# Patient Record
Sex: Female | Born: 1990
Health system: Southern US, Community
[De-identification: ages and names within clinical notes are randomized; demographics above are authoritative.]

## PROBLEM LIST (undated history)

## (undated) DIAGNOSIS — K219 Gastro-esophageal reflux disease without esophagitis: Secondary | ICD-10-CM

---
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
## 2011-06-30 HISTORY — PX: MOUTH SURGERY: SHX715

## 2015-10-05 ENCOUNTER — Emergency Department (HOSPITAL_COMMUNITY)
Admission: EM | Admit: 2015-10-05 | Discharge: 2015-10-05 | Disposition: A | Discharge status: Home / Self Care | Payer: No Typology Code available for payment source | Attending: Emergency Medicine | Admitting: Emergency Medicine

## 2015-10-05 ENCOUNTER — Encounter (HOSPITAL_COMMUNITY): Payer: Self-pay | Admitting: *Deleted

## 2015-10-05 ENCOUNTER — Emergency Department (HOSPITAL_COMMUNITY): Payer: No Typology Code available for payment source

## 2015-10-05 DIAGNOSIS — Z3202 Encounter for pregnancy test, result negative: Secondary | ICD-10-CM | POA: Diagnosis not present

## 2015-10-05 DIAGNOSIS — Y998 Other external cause status: Secondary | ICD-10-CM | POA: Diagnosis not present

## 2015-10-05 DIAGNOSIS — M62830 Muscle spasm of back: Secondary | ICD-10-CM | POA: Insufficient documentation

## 2015-10-05 DIAGNOSIS — S199XXA Unspecified injury of neck, initial encounter: Secondary | ICD-10-CM | POA: Diagnosis present

## 2015-10-05 DIAGNOSIS — Y92410 Unspecified street and highway as the place of occurrence of the external cause: Secondary | ICD-10-CM | POA: Insufficient documentation

## 2015-10-05 DIAGNOSIS — S161XXA Strain of muscle, fascia and tendon at neck level, initial encounter: Secondary | ICD-10-CM | POA: Diagnosis not present

## 2015-10-05 DIAGNOSIS — Y9389 Activity, other specified: Secondary | ICD-10-CM | POA: Insufficient documentation

## 2015-10-05 DIAGNOSIS — S3992XA Unspecified injury of lower back, initial encounter: Secondary | ICD-10-CM | POA: Diagnosis not present

## 2015-10-05 DIAGNOSIS — F17200 Nicotine dependence, unspecified, uncomplicated: Secondary | ICD-10-CM | POA: Diagnosis not present

## 2015-10-05 MED ORDER — NAPROXEN 500 MG PO TABS: 500.0000 mg | ORAL_TABLET | Freq: Two times a day (BID) | ORAL | Status: DC

## 2015-10-05 MED ORDER — CYCLOBENZAPRINE HCL 5 MG PO TABS: 5.0000 mg | ORAL_TABLET | Freq: Three times a day (TID) | ORAL | Status: DC | PRN
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
## 2015-10-05 NOTE — Discharge Instructions (Signed)
Do not take the muscle relaxant if driving as it will make you sleepy.  °

## 2015-10-07 LAB — POC URINE PREG, ED: Preg Test, Ur: NEGATIVE

## 2017-05-09 ENCOUNTER — Emergency Department (HOSPITAL_COMMUNITY): Payer: Managed Care, Other (non HMO)

## 2017-05-09 ENCOUNTER — Encounter (HOSPITAL_COMMUNITY): Payer: Self-pay

## 2017-05-09 ENCOUNTER — Emergency Department (HOSPITAL_COMMUNITY)
Admission: EM | Admit: 2017-05-09 | Discharge: 2017-05-10 | Disposition: A | Discharge status: Home / Self Care | Payer: Managed Care, Other (non HMO) | Attending: Emergency Medicine | Admitting: Emergency Medicine

## 2017-05-09 ENCOUNTER — Other Ambulatory Visit: Payer: Self-pay

## 2017-05-09 DIAGNOSIS — X509XXA Other and unspecified overexertion or strenuous movements or postures, initial encounter: Secondary | ICD-10-CM | POA: Diagnosis not present

## 2017-05-09 DIAGNOSIS — Y999 Unspecified external cause status: Secondary | ICD-10-CM | POA: Insufficient documentation

## 2017-05-09 DIAGNOSIS — S93402A Sprain of unspecified ligament of left ankle, initial encounter: Secondary | ICD-10-CM | POA: Insufficient documentation

## 2017-05-09 DIAGNOSIS — Y92009 Unspecified place in unspecified non-institutional (private) residence as the place of occurrence of the external cause: Secondary | ICD-10-CM | POA: Insufficient documentation

## 2017-05-09 DIAGNOSIS — M79672 Pain in left foot: Secondary | ICD-10-CM

## 2017-05-09 DIAGNOSIS — M25562 Pain in left knee: Secondary | ICD-10-CM

## 2017-05-09 DIAGNOSIS — S99922A Unspecified injury of left foot, initial encounter: Secondary | ICD-10-CM | POA: Diagnosis present

## 2017-05-09 DIAGNOSIS — F17200 Nicotine dependence, unspecified, uncomplicated: Secondary | ICD-10-CM | POA: Diagnosis not present

## 2017-05-09 DIAGNOSIS — Y9383 Activity, rough housing and horseplay: Secondary | ICD-10-CM | POA: Diagnosis not present

## 2017-05-09 HISTORY — DX: Gastro-esophageal reflux disease without esophagitis: K21.9

## 2017-05-09 MED ORDER — IBUPROFEN 800 MG PO TABS: 800.0000 mg | ORAL_TABLET | Freq: Three times a day (TID) | ORAL | 0 refills | Status: DC | PRN

## 2017-05-09 NOTE — ED Provider Notes (Signed)
  Physical Exam  BP 135/84   Pulse 86   Temp 99 F (37.2 C) (Oral)   Resp 17   LMP 04/15/2017   SpO2 100%   Physical Exam  Constitutional: She appears well-developed and well-nourished. No distress.  Sitting comfortably in bed.  HENT:  Head: Normocephalic and atraumatic.  Eyes: Conjunctivae are normal. Right eye exhibits no discharge. Left eye exhibits no discharge.  EOMs normal to gross examination.  Neck: Normal range of motion.  Cardiovascular: Normal rate and regular rhythm.  Intact, 2+ DP pulse of the left lower extremity.  Pulmonary/Chest:  Normal respiratory effort. Patient converses comfortably. No audible wheeze or stridor.  Abdominal: She exhibits no distension.  Musculoskeletal: Normal range of motion.  Compartments soft.  Neurological: She is alert.  Cranial nerves intact to gross observation. Patient moves extremities without difficulty.  Skin: Skin is warm and dry. She is not diaphoretic.  Psychiatric: She has a normal mood and affect. Her behavior is normal. Judgment and thought content normal.  Nursing note and vitals reviewed.   ED Course  Procedures  Assumed care from Dr. Jacqulyn BathLong at 2130. Briefly, the patient is a 26 y.o. female with PMHx of  has a past medical history of GERD (gastroesophageal reflux disease). here with ankle pain secondary to a hyperextension injury that occurred while playing with her children.   Labs Reviewed - No data to display  Course of Care:  On handoff, plan is a patient does not have a proximal fibula fracture, she can be safely discharged with Aircast, crutches, and weightbearing as tolerated.  MDM On my final evaluation, patient was well-appearing and in no acute distress.  Patient informed of results that proximal fibula is not fractured.  Patient given follow-up with Dr. Pearletha ForgeHudnall and sports medicine.  Return precautions given for any signs of compartment syndrome such as increasing swelling, pallor, increasing pain, or a cold  extremity.  Counseled patient on RICE therapy.  Patient is in understanding and agrees with the plan of care.      Elisha PonderMurray, Marquay Kruse B, PA-C 05/09/17 2338    Maia PlanLong, Joshua G, MD 05/10/17 [phone]

## 2017-05-09 NOTE — ED Notes (Signed)
Patient transported to X-ray 

## 2017-05-09 NOTE — ED Provider Notes (Signed)
Emergency Department Provider Note   I have reviewed the triage vital signs and the nursing notes.   HISTORY  Chief Complaint  Ankle pain  HPI Catherine SprungLaura Figueroa is a 26 y.o. female with PMH of GERD presents to the emergency department for evaluation of left ankle pain and swelling after injury earlier today.  The patient was playing with her children at approximately 10 AM when she rolled the ankle.  She has been walking on the foot throughout the day but has had progressively worsening ankle and foot pain with swelling.  She is tried over-the-counter pain medication with mild relief.  When pain continued throughout the evening she presented to the emergency department.  She denies any falls with head trauma.  No hip discomfort.  Denies knee pain.  No numbness or tingling in the foot.   Past Medical History:  Diagnosis Date  . GERD (gastroesophageal reflux disease)     There are no active problems to display for this patient.   History reviewed. No pertinent surgical history.  Current Outpatient Rx  . Order #: [id number] Class: Print  . Order #: [id number] Class: Print  . Order #: [id number] Class: Print    Allergies Patient has no known allergies.  No family history on file.  Social History Social History   Tobacco Use  . Smoking status: Current Every Day Smoker  . Smokeless tobacco: Never Used  Substance Use Topics  . Alcohol use: No  . Drug use: Not on file    Review of Systems  Constitutional: No fever/chills Eyes: No visual changes. ENT: No sore throat. Cardiovascular: Denies chest pain. Respiratory: Denies shortness of breath. Gastrointestinal: No abdominal pain.  No nausea, no vomiting.  No diarrhea.  No constipation. Genitourinary: Negative for dysuria. Musculoskeletal: Negative for back pain. Positive left ankle pain.  Skin: Negative for rash. Neurological: Negative for headaches, focal weakness or numbness.  10-point ROS otherwise  negative.  ____________________________________________   PHYSICAL EXAM:  VITAL SIGNS: ED Triage Vitals  Enc Vitals Group     BP 05/09/17 1828 (!) 143/81     Pulse Rate 05/09/17 1828 100     Resp 05/09/17 1828 16     Temp 05/09/17 1828 99 F (37.2 C)     Temp Source 05/09/17 1828 Oral     SpO2 05/09/17 1828 100 %     Pain Score 05/09/17 1827 10    Constitutional: Alert and oriented. Well appearing and in no acute distress. Eyes: Conjunctivae are normal.  Head: Atraumatic. Nose: No congestion/rhinnorhea. Mouth/Throat: Mucous membranes are moist.  Neck: No stridor.   Cardiovascular: Good peripheral circulation.  Respiratory: Normal respiratory effort.   Gastrointestinal: No distention.  Musculoskeletal: Left ankle tenderness laterally. No midfoot tenderness to palpation. Patient does have some left proximal fibular tenderness to palpation on the left.  Neurologic:  Normal speech and language. No gross focal neurologic deficits are appreciated.  Skin:  Skin is warm, dry and intact. No rash noted.  ____________________________________________  RADIOLOGY  Dg Tibia/fibula Left  Result Date: 05/09/2017 CLINICAL DATA:  Left lower leg tenderness beginning today. EXAM: LEFT TIBIA AND FIBULA - 2 VIEW COMPARISON:  None. FINDINGS: There is no evidence of fracture or other focal bone lesions. Soft tissues are unremarkable. IMPRESSION: Negative. Electronically Signed   By: Elberta Fortisaniel  Boyle M.D.   On: 05/09/2017 21:58   Dg Ankle Complete Left  Result Date: 05/09/2017 CLINICAL DATA:  Rolled left ankle and hyperextended left foot while playing with children today. EXAM: LEFT ANKLE  COMPLETE - 3+ VIEW COMPARISON:  None. FINDINGS: There is no evidence of fracture, dislocation, or joint effusion. There is no evidence of arthropathy or other focal bone abnormality. Soft tissues are unremarkable. IMPRESSION: Negative. Electronically Signed   By: Elberta Fortisaniel  Boyle M.D.   On: 05/09/2017 19:54   Dg Foot  Complete Left  Result Date: 05/09/2017 CLINICAL DATA:  Rolled left ankle with hyperextension of left foot this morning while applying. EXAM: LEFT FOOT - COMPLETE 3+ VIEW COMPARISON:  None. FINDINGS: There is no evidence of fracture or dislocation. There is no evidence of arthropathy or other focal bone abnormality. Soft tissues are unremarkable. IMPRESSION: Negative. Electronically Signed   By: Elberta Fortisaniel  Boyle M.D.   On: 05/09/2017 19:54    ____________________________________________   PROCEDURES  Procedure(s) performed:   Procedures  None ____________________________________________   INITIAL IMPRESSION / ASSESSMENT AND PLAN / ED COURSE  Pertinent labs & imaging results that were available during my care of the patient were reviewed by me and considered in my medical decision making (see chart for details).  Patient presents emergency department for evaluation of left ankle and foot pain after injury this morning.  Plain films from triage showed no fracture.  Suspect ankle sprain.  Plan for crutches and Aircast.  On my evaluation the patient also has some proximal fibular tenderness.  Compartments are soft.  Lower extremity is neurovascularly intact.  Plan to send patient back to x-ray for knee films to rule out fibular fracture.   Plain films negative. Air cast and crutches provided for comfort. Plan for RICE at home.   At this time, I do not feel there is any life-threatening condition present. I have reviewed and discussed all results (EKG, imaging, lab, urine as appropriate), exam findings with patient. I have reviewed nursing notes and appropriate previous records.  I feel the patient is safe to be discharged home without further emergent workup. Discussed usual and customary return precautions. Patient and family (if present) verbalize understanding and are comfortable with this plan.  Patient will follow-up with their primary care provider. If they do not have a primary care  provider, information for follow-up has been provided to them. All questions have been answered.  ____________________________________________  FINAL CLINICAL IMPRESSION(S) / ED DIAGNOSES  Final diagnoses:  Sprain of left ankle, unspecified ligament, initial encounter  Left foot pain  Acute pain of left knee     MEDICATIONS GIVEN DURING THIS VISIT:  None  NEW OUTPATIENT MEDICATIONS STARTED DURING THIS VISIT:  Motrin 800 mg   Note:  This document was prepared using Dragon voice recognition software and may include unintentional dictation errors.  Alona BeneJoshua Montavious Wierzba, MD Emergency Medicine    Jamiya Nims, Arlyss RepressJoshua G, MD 05/10/17 [phone]

## 2017-05-09 NOTE — ED Triage Notes (Signed)
Patient rolled ankle this am while playing with kids this am, pain with any ROM to ankle and foot, no obvious deformity

## 2017-05-09 NOTE — ED Notes (Signed)
Pt verbalized understanding discharge instructions and denies any further needs or questions at this time. VS stable, ambulatory and steady gait.   

## 2017-05-09 NOTE — Progress Notes (Signed)
Orthopedic Tech Progress Note Patient Details:  Catherine Figueroa [birth year] [id number]  Ortho Devices Type of Ortho Device: Ankle Air splint, Crutches Ortho Device/Splint Location: LLE Ortho Device/Splint Interventions: Ordered, Application, Adjustment   Jennye MoccasinHughes, Adalid Beckmann Craig 05/09/2017, 9:45 PM

## 2017-05-09 NOTE — Discharge Instructions (Signed)
As we discussed, you do not have any broken or dislocated bones in your foot or ankle, but you do have an ankle sprain. There is always a chance that a small fracture did not appear on today's x-ray. Please read through the included information about routine injury care (RICE = rest, ice, compression, elevation), and take over-the-counter pain medicine according to label instructions.  If you do not have any reason to avoid ibuprofen, you can also consider taking ibuprofen 600 mg 3 times a day with meals, but do this for no more than 5 days as it may cause to some stomach discomfort over time.  Use crutches if provided and you may bear weight as tolerated.  Follow-up is recommended with the orthopedic surgeon or with your regular doctor. ° ° °Ankle Sprain °An ankle sprain is an injury to the strong, fibrous tissues (ligaments) that hold the bones of your ankle joint together.  °CAUSES °An ankle sprain is usually caused by a fall or by twisting your ankle. Ankle sprains most commonly occur when you step on the outer edge of your foot, and your ankle turns inward. People who participate in sports are more prone to these types of injuries.  °SYMPTOMS  °Pain in your ankle. The pain may be present at rest or only when you are trying to stand or walk. °Swelling. °Bruising. Bruising may develop immediately or within 1 to 2 days after your injury. °Difficulty standing or walking, particularly when turning corners or changing directions. °DIAGNOSIS  °Your caregiver will ask you details about your injury and perform a physical exam of your ankle to determine if you have an ankle sprain. During the physical exam, your caregiver will press on and apply pressure to specific areas of your foot and ankle. Your caregiver will try to move your ankle in certain ways. An X-ray exam may be done to be sure a bone was not broken or a ligament did not separate from one of the bones in your ankle (avulsion fracture).  °TREATMENT  °Certain  types of braces can help stabilize your ankle. Your caregiver can make a recommendation for this. Your caregiver may recommend the use of medicine for pain. If your sprain is severe, your caregiver may refer you to a surgeon who helps to restore function to parts of your skeletal system (orthopedist) or a physical therapist. °HOME CARE INSTRUCTIONS  °Apply ice to your injury for 1-2 days or as directed by your caregiver. Applying ice helps to reduce inflammation and pain. °Put ice in a plastic bag. °Place a towel between your skin and the bag. °Leave the ice on for 15-20 minutes at a time, every 2 hours while you are awake. °Only take over-the-counter or prescription medicines for pain, discomfort, or fever as directed by your caregiver. °Elevate your injured ankle above the level of your heart as much as possible for 2-3 days. °If your caregiver recommends crutches, use them as instructed. Gradually put weight on the affected ankle. Continue to use crutches or a cane until you can walk without feeling pain in your ankle. °If you have a plaster splint, wear the splint as directed by your caregiver. Do not rest it on anything harder than a pillow for the first 24 hours. Do not put weight on it. Do not get it wet. You may take it off to take a shower or bath. °You may have been given an elastic bandage to wear around your ankle to provide support. If the elastic bandage   is too tight (you have numbness or tingling in your foot or your foot becomes cold and blue), adjust the bandage to make it comfortable. °If you have an air splint, you may blow more air into it or let air out to make it more comfortable. You may take your splint off at night and before taking a shower or bath. Wiggle your toes in the splint several times per day to decrease swelling. °SEEK MEDICAL CARE IF:  °You have rapidly increasing bruising or swelling. °Your toes feel extremely cold or you lose feeling in your foot. °Your pain is not relieved  with medicine. °SEEK IMMEDIATE MEDICAL CARE IF: °Your toes are numb or blue. °You have severe pain that is increasing. °MAKE SURE YOU:  °Understand these instructions. °Will watch your condition. °Will get help right away if you are not doing well or get worse. °  °This information is not intended to replace advice given to you by your health care provider. Make sure you discuss any questions you have with your health care provider. °  °Document Released: 06/15/2005 Document Revised: 07/06/2014 Document Reviewed: 06/27/2011 °Elsevier Interactive Patient Education ©2016 [employer] ° °Elastic Bandage and RICE °WHAT DOES AN ELASTIC BANDAGE DO? °Elastic bandages come in different shapes and sizes. They generally provide support to your injury and reduce swelling while you are healing, but they can perform different functions. Your health care provider will help you to decide what is best for your protection, recovery, or rehabilitation following an injury. °WHAT ARE SOME GENERAL TIPS FOR USING AN ELASTIC BANDAGE? °Use the bandage as directed by the maker of the bandage that you are using. °Do not wrap the bandage too tightly. This may cut off the circulation in the arm or leg in the area below the bandage. °If part of your body beyond the bandage becomes blue, numb, cold, swollen, or is more painful, your bandage is most likely too tight. If this occurs, remove your bandage and reapply it more loosely. °See your health care provider if the bandage seems to be making your problems worse rather than better. °An elastic bandage should be removed and reapplied every 3-4 hours or as directed by your health care provider. °WHAT IS RICE? °The routine care of many injuries includes rest, ice, compression, and elevation (RICE therapy).  °Rest °Rest is required to allow your body to heal. Generally, you can resume your routine activities when you are comfortable and have been given permission by your health care  provider. °Ice °Icing your injury helps to keep the swelling down and it reduces pain. Do not apply ice directly to your skin. °Put ice in a plastic bag. °Place a towel between your skin and the bag. °Leave the ice on for 20 minutes, 2-3 times per day. °Do this for as Aram Domzalski as you are directed by your health care provider. °Compression °Compression helps to keep swelling down, gives support, and helps with discomfort. Compression may be done with an elastic bandage. °Elevation °Elevation helps to reduce swelling and it decreases pain. If possible, your injured area should be placed at or above the level of your heart or the center of your chest. °WHEN SHOULD I SEEK MEDICAL CARE? °You should seek medical care if: °You have persistent pain and swelling. °Your symptoms are getting worse rather than improving. °These symptoms may indicate that further evaluation or further X-rays are needed. Sometimes, X-rays may not show a small broken bone (fracture) until a number of days later. Make   a follow-up appointment with your health care provider. Ask when your X-ray results will be ready. Make sure that you get your X-ray results. °WHEN SHOULD I SEEK IMMEDIATE MEDICAL CARE? °You should seek immediate medical care if: °You have a sudden onset of severe pain at or below the area of your injury. °You develop redness or increased swelling around your injury. °You have tingling or numbness at or below the area of your injury that does not improve after you remove the elastic bandage. °  °This information is not intended to replace advice given to you by your health care provider. Make sure you discuss any questions you have with your health care provider. °  °Document Released: 12/05/2001 Document Revised: 03/06/2015 Document Reviewed: 01/29/2014 °Elsevier Interactive Patient Education ©2016 [employer] Inc[employer] ° °  °

## 2017-05-11 ENCOUNTER — Ambulatory Visit (INDEPENDENT_AMBULATORY_CARE_PROVIDER_SITE_OTHER): Payer: Managed Care, Other (non HMO) | Admitting: Family Medicine

## 2017-05-11 ENCOUNTER — Encounter: Payer: Self-pay | Admitting: Family Medicine

## 2017-05-11 DIAGNOSIS — S99912A Unspecified injury of left ankle, initial encounter: Secondary | ICD-10-CM | POA: Diagnosis not present

## 2017-05-11 MED ORDER — MELOXICAM 7.5 MG PO TABS: 7.5000 mg | ORAL_TABLET | Freq: Every day | ORAL | 1 refills | Status: DC

## 2017-05-11 MED ORDER — HYDROCODONE-ACETAMINOPHEN 5-325 MG PO TABS: 1.0000 | ORAL_TABLET | ORAL | 0 refills | Status: DC | PRN

## 2017-05-11 NOTE — Patient Instructions (Addendum)
You have a severe ankle sprain. Ice the area for 15 minutes at a time, 3-4 times a day Meloxicam 7.5mg  daily with food for pain and inflammation. Norco as needed for severe pain. Elevate above the level of your heart when possible Crutches if needed to help with walking Bear weight when tolerated Use boot when up and walking around to help with stability while you recover from this injury. Come out of the boot twice a day to do Up/down and alphabet exercises 2-3 sets of each. Consider physical therapy for strengthening and balance exercises in the future. If not improving as expected, we may repeat x-rays or consider further testing like an MRI. Follow up in 2 weeks (1 week at the earliest).

## 2017-05-12 ENCOUNTER — Encounter: Payer: Self-pay | Admitting: Family Medicine

## 2017-05-12 DIAGNOSIS — S99912D Unspecified injury of left ankle, subsequent encounter: Secondary | ICD-10-CM | POA: Insufficient documentation

## 2017-05-12 NOTE — Progress Notes (Signed)
PCP: Patient, No Pcp Per  Subjective:   HPI: Patient is a 26 y.o. female here for left ankle injury.  Patient reports on 11/11 she was playing with her kids when she suffered hyperplantarflexion injury to her left ankle. Unable to bear weight since this. Pain is 7/10 level but up to 10/10 and sharp, anterior ankle. Has been taking tylenol, icing. Using crutches and not putting weight on this foot. Wearing short aircast brace but feels uncomfortable. + swelling but no skin changes. History of ankle sprains in the past.  Past Medical History:  Diagnosis Date  . GERD (gastroesophageal reflux disease)     Current Outpatient Medications on File Prior to Visit  Medication Sig Dispense Refill  . cyclobenzaprine (FLEXERIL) 5 MG tablet Take 1 tablet (5 mg total) by mouth 3 (three) times daily as needed for muscle spasms. 30 tablet 0   No current facility-administered medications on file prior to visit.     History reviewed. No pertinent surgical history.  No Known Allergies  Social History   Socioeconomic History  . Marital status: Married    Spouse name: Not on file  . Number of children: Not on file  . Years of education: Not on file  . Highest education level: Not on file  Social Needs  . Financial resource strain: Not on file  . Food insecurity - worry: Not on file  . Food insecurity - inability: Not on file  . Transportation needs - medical: Not on file  . Transportation needs - non-medical: Not on file  Occupational History  . Not on file  Tobacco Use  . Smoking status: Current Every Day Smoker  . Smokeless tobacco: Never Used  Substance and Sexual Activity  . Alcohol use: No  . Drug use: Not on file  . Sexual activity: Not on file  Other Topics Concern  . Not on file  Social History Narrative  . Not on file    History reviewed. No pertinent family history.  BP 110/76   Pulse 86   Ht 5\' 5"  (1.651 m)   Wt 145 lb (65.8 kg)   LMP 04/15/2017   BMI 24.13  kg/m   Review of Systems: See HPI above.     Objective:  Physical Exam:  Gen: NAD, guarding during exam  Left ankle/foot: Mild anterior ankle swelling.  No bruising, other deformity. Very limited motion all directions but able to do so with some strength. TTP anterior ankle joint, proximal dorsal foot Pain with ant drawer and talar tilt, guarding. Negative syndesmotic compression. Thompsons test negative. NV intact distally.  Right ankle/foot: No gross deformity, swelling, ecchymoses FROM with full strength. No TTP Negative ant drawer and talar tilt.   NV intact distally.   Assessment & Plan:  1. Left ankle injury - independently reviewed radiographs and no evidence fracture.  Switch to cam walker with crutches as needed.  Meloxicam with norco as needed for severe pain.  Icing, elevation.  Shown motion exercises to do daily.  F/u in 2 weeks.

## 2017-05-12 NOTE — Assessment & Plan Note (Signed)
independently reviewed radiographs and no evidence fracture.  Switch to cam walker with crutches as needed.  Meloxicam with norco as needed for severe pain.  Icing, elevation.  Shown motion exercises to do daily.  F/u in 2 weeks.

## 2017-05-25 ENCOUNTER — Ambulatory Visit (HOSPITAL_BASED_OUTPATIENT_CLINIC_OR_DEPARTMENT_OTHER)
Admission: RE | Admit: 2017-05-25 | Discharge: 2017-05-25 | Disposition: A | Discharge status: Home / Self Care | Payer: Managed Care, Other (non HMO) | Source: Ambulatory Visit | Attending: Family Medicine | Admitting: Family Medicine

## 2017-05-25 ENCOUNTER — Ambulatory Visit (INDEPENDENT_AMBULATORY_CARE_PROVIDER_SITE_OTHER): Payer: Managed Care, Other (non HMO) | Admitting: Family Medicine

## 2017-05-25 ENCOUNTER — Encounter: Payer: Self-pay | Admitting: Family Medicine

## 2017-05-25 VITALS — BP 123/85 | HR 88 | Ht 65.0 in | Wt 140.0 lb

## 2017-05-25 DIAGNOSIS — S99922D Unspecified injury of left foot, subsequent encounter: Secondary | ICD-10-CM

## 2017-05-25 DIAGNOSIS — S99912D Unspecified injury of left ankle, subsequent encounter: Secondary | ICD-10-CM

## 2017-05-25 DIAGNOSIS — X58XXXD Exposure to other specified factors, subsequent encounter: Secondary | ICD-10-CM | POA: Diagnosis not present

## 2017-05-25 NOTE — Patient Instructions (Signed)
You have a severe ankle sprain. Ice the area for 15 minutes at a time, 3-4 times a day Meloxicam 7.5mg  daily with food for pain and inflammation. Elevate above the level of your heart when possible Use boot when up and walking around to help with stability while you recover from this injury. Come out of the boot twice a day to do Up/down and alphabet exercises 2-3 sets of each. When tolerated start doing the theraband strengthening exercises 3 sets of 10 once a day with yellow theraband. Let me know if you want to go ahead with physical therapy or MRI. Follow up with me right before or after christmas for reevaluation otherwise.

## 2017-05-26 ENCOUNTER — Encounter: Payer: Self-pay | Admitting: Family Medicine

## 2017-05-26 NOTE — Assessment & Plan Note (Signed)
still guarding on exam and tender diffusely - no evidence CRPS.  Radiographs were reassuring.  Brief MSK u/s without evidence occult fracture malleoli, talus, and without evidence tendon rupture.  Continue cam walker.  Meloxicam, icing, elevation.  She declined physical therapy, MRI for now - call us if she would like to proceed with therapy.  F/u in 4-6 weeks.

## 2017-05-26 NOTE — Progress Notes (Signed)
PCP: Catherine Figueroa, No Pcp Per  Subjective:   HPI: Catherine Figueroa is a 26 y.o. female here for left ankle injury.  11/13: Catherine Figueroa reports on 11/11 she was playing with her kids when she suffered hyperplantarflexion injury to her left ankle. Unable to bear weight since this. Pain is 7/10 level but up to 10/10 and sharp, anterior ankle. Has been taking tylenol, icing. Using crutches and not putting weight on this foot. Wearing short aircast brace but feels uncomfortable. + swelling but no skin changes. History of ankle sprains in the past.  11/27: Catherine Figueroa reports she feels about the same as last visit. Wearing cam walker. Crutches bothered her so not using. Taking mobic, rarely doing motion exercises due to pain. Pain level 6/10 and sharp. Constant throb when resting. More swelling at end of day. No skin changes, mild swelling.  Past Medical History:  Diagnosis Date  . GERD (gastroesophageal reflux disease)     Current Outpatient Medications on File Prior to Visit  Medication Sig Dispense Refill  . cyclobenzaprine (FLEXERIL) 5 MG tablet Take 1 tablet (5 mg total) by mouth 3 (three) times daily as needed for muscle spasms. 30 tablet 0  . HYDROcodone-acetaminophen (NORCO) 5-325 MG tablet Take 1 tablet every 4 (four) hours as needed by mouth for moderate pain. 30 tablet 0  . meloxicam (MOBIC) 7.5 MG tablet Take 1 tablet (7.5 mg total) daily by mouth. 30 tablet 1   No current facility-administered medications on file prior to visit.     History reviewed. No pertinent surgical history.  No Known Allergies  Social History   Socioeconomic History  . Marital status: Married    Spouse name: Not on file  . Number of children: Not on file  . Years of education: Not on file  . Highest education level: Not on file  Social Needs  . Financial resource strain: Not on file  . Food insecurity - worry: Not on file  . Food insecurity - inability: Not on file  . Transportation needs - medical:  Not on file  . Transportation needs - non-medical: Not on file  Occupational History  . Not on file  Tobacco Use  . Smoking status: Current Every Day Smoker  . Smokeless tobacco: Never Used  Substance and Sexual Activity  . Alcohol use: No  . Drug use: Not on file  . Sexual activity: Not on file  Other Topics Concern  . Not on file  Social History Narrative  . Not on file    History reviewed. No pertinent family history.  BP 123/85   Pulse 88   Ht 5\' 5"  (1.651 m)   Wt 140 lb (63.5 kg)   LMP 05/04/2017   BMI 23.30 kg/m   Review of Systems: See HPI above.     Objective:  Physical Exam:  Gen: NAD, mild guarding during exam.  Left ankle/foot: Minimal swelling anterior ankle.  No bruising, other deformity. Very limited motion all directions.  Some strength all directions. TTP diffusely dorsal foot, anterior ankle joint including malleoli. 1+ ant drawer and talar tilt, painful. Thompsons test negative. NV intact distally.   Assessment & Plan:  1. Left ankle injury - still guarding on exam and tender diffusely - no evidence CRPS.  Radiographs were reassuring.  Brief MSK u/s without evidence occult fracture malleoli, talus, and without evidence tendon rupture.  Continue cam walker.  Meloxicam, icing, elevation.  She declined physical therapy, MRI for now - call us if she would like to proceed  with therapy.  F/u in 4-6 weeks.

## 2017-05-28 ENCOUNTER — Telehealth: Payer: Self-pay | Admitting: Family Medicine

## 2017-05-28 NOTE — Telephone Encounter (Signed)
Patient picked up letter. She said she would think about physical therapy and give us a call

## 2017-05-28 NOTE — Telephone Encounter (Signed)
Letter written to be out for 2 weeks tentatively.  If she's struggling I'd also strongly recommend she start physical therapy if she's up for this.

## 2017-05-28 NOTE — Telephone Encounter (Signed)
Patient called requesting a letter to be excused from work. States after working the past two days her ankle has swollen up and it is difficult to walk around on

## 2017-06-15 ENCOUNTER — Ambulatory Visit: Payer: Managed Care, Other (non HMO) | Admitting: Family Medicine

## 2017-08-25 ENCOUNTER — Encounter (HOSPITAL_COMMUNITY): Payer: Self-pay | Admitting: *Deleted

## 2017-08-25 ENCOUNTER — Emergency Department (HOSPITAL_COMMUNITY)
Admission: EM | Admit: 2017-08-25 | Discharge: 2017-08-25 | Disposition: A | Discharge status: Home / Self Care | Payer: Self-pay | Attending: Emergency Medicine | Admitting: Emergency Medicine

## 2017-08-25 ENCOUNTER — Other Ambulatory Visit: Payer: Self-pay

## 2017-08-25 ENCOUNTER — Emergency Department (HOSPITAL_COMMUNITY): Payer: Self-pay

## 2017-08-25 DIAGNOSIS — M25512 Pain in left shoulder: Secondary | ICD-10-CM | POA: Insufficient documentation

## 2017-08-25 DIAGNOSIS — R0981 Nasal congestion: Secondary | ICD-10-CM | POA: Insufficient documentation

## 2017-08-25 DIAGNOSIS — B9789 Other viral agents as the cause of diseases classified elsewhere: Secondary | ICD-10-CM

## 2017-08-25 DIAGNOSIS — Z79899 Other long term (current) drug therapy: Secondary | ICD-10-CM | POA: Insufficient documentation

## 2017-08-25 DIAGNOSIS — F17200 Nicotine dependence, unspecified, uncomplicated: Secondary | ICD-10-CM | POA: Insufficient documentation

## 2017-08-25 DIAGNOSIS — J069 Acute upper respiratory infection, unspecified: Secondary | ICD-10-CM | POA: Insufficient documentation

## 2017-08-25 DIAGNOSIS — R05 Cough: Secondary | ICD-10-CM | POA: Insufficient documentation

## 2017-08-25 DIAGNOSIS — J029 Acute pharyngitis, unspecified: Secondary | ICD-10-CM | POA: Insufficient documentation

## 2017-08-25 LAB — CBC WITH DIFFERENTIAL/PLATELET
Basophils Absolute: 0.1 10*3/uL (ref 0.0–0.1)
Basophils Relative: 1 %
Eosinophils Absolute: 0.3 10*3/uL (ref 0.0–0.7)
Eosinophils Relative: 2 %
HCT: 40.8 % (ref 36.0–46.0)
Hemoglobin: 13.8 g/dL (ref 12.0–15.0)
Lymphocytes Relative: 30 %
Lymphs Abs: 3.2 10*3/uL (ref 0.7–4.0)
MCH: 31.2 pg (ref 26.0–34.0)
MCHC: 33.8 g/dL (ref 30.0–36.0)
MCV: 92.1 fL (ref 78.0–100.0)
Monocytes Absolute: 0.8 10*3/uL (ref 0.1–1.0)
Monocytes Relative: 7 %
Neutro Abs: 6.4 10*3/uL (ref 1.7–7.7)
Neutrophils Relative %: 60 %
Platelets: 250 10*3/uL (ref 150–400)
RBC: 4.43 MIL/uL (ref 3.87–5.11)
RDW: 12.6 % (ref 11.5–15.5)
WBC: 10.6 10*3/uL — ABNORMAL HIGH (ref 4.0–10.5)

## 2017-08-25 LAB — I-STAT BETA HCG BLOOD, ED (MC, WL, AP ONLY)

## 2017-08-25 MED ORDER — CYCLOBENZAPRINE HCL 10 MG PO TABS: 10.0000 mg | ORAL_TABLET | Freq: Two times a day (BID) | ORAL | 0 refills | Status: DC | PRN

## 2017-08-25 NOTE — Discharge Instructions (Signed)
Please read attached information. If you experience any new or worsening signs or symptoms please return to the emergency room for evaluation. Please follow-up with your primary care provider or specialist as discussed. Please use medication prescribed only as directed and discontinue taking if you have any concerning signs or symptoms.   °

## 2017-08-25 NOTE — ED Notes (Signed)
Declined W/C at D/C and was escorted to lobby by RN. 

## 2017-08-25 NOTE — ED Triage Notes (Signed)
C/o cough fever off and on x several days . C/o left shoulder pain for 1 week. Denies injury

## 2017-08-25 NOTE — ED Provider Notes (Signed)
[facility] EMERGENCY DEPARTMENT Provider Note   CSN: [id number] Arrival date & time: 08/25/17  1023   History   Chief Complaint Chief Complaint  Patient presents with  . Fever  . Sore Throat  . Cough  . Shoulder Pain    HPI Catherine Figueroa is a 27 y.o. female.  HPI   27 year old female presents today with complaints of upper respiratory infection.  Patient notes 3 days ago she developed a fever of 101, sore throat, cough, nasal congestion.  Patient denies any fever today, reports a dry nonproductive cough, with additional clear sputum.  She notes some minor discomfort in the throat nonsevere no difficulty swallowing or breathing.  She notes several episodes of nonbloody, nonbilious emesis.  She has been tolerating p.o. without difficulty.  She denies any significant past medical history.  She also reports pain in her left shoulder worse with range of motion worse with palpation no swelling or edema redness no loss of distal sensation strength and motor function.  Patient reports she works that she eats lifting heavy boxes.  He did not receive an influenza vaccine.   Past Medical History:  Diagnosis Date  . GERD (gastroesophageal reflux disease)     Patient Active Problem List   Diagnosis Date Noted  . Left ankle injury, subsequent encounter 05/12/2017  . Esophageal reflux 11/14/2007  . Allergic rhinitis 10/20/2007    History reviewed. No pertinent surgical history.  OB History    No data available       Home Medications    Prior to Admission medications   Medication Sig Start Date End Date Taking? Authorizing Provider  cyclobenzaprine (FLEXERIL) 10 MG tablet Take 1 tablet (10 mg total) by mouth 2 (two) times daily as needed for muscle spasms. 08/25/17   Gianah Batt, Tinnie Gens, PA-C  HYDROcodone-acetaminophen (NORCO) 5-325 MG tablet Take 1 tablet every 4 (four) hours as needed by mouth for moderate pain. 05/11/17   Lenda Kelp, MD  meloxicam (MOBIC) 7.5  MG tablet Take 1 tablet (7.5 mg total) daily by mouth. 05/11/17   Hudnall, Azucena Fallen, MD    Family History No family history on file.  Social History Social History   Tobacco Use  . Smoking status: Current Every Day Smoker  . Smokeless tobacco: Never Used  Substance Use Topics  . Alcohol use: Yes  . Drug use: No     Allergies   Patient has no known allergies.   Review of Systems Review of Systems  All other systems reviewed and are negative.  Physical Exam Updated Vital Signs BP 123/75 (BP Location: Right Arm)   Pulse 84   Temp 98.6 F (37 C) (Oral)   Resp 17   Ht 5\' 4"  (1.626 m)   Wt 72.6 kg (160 lb)   LMP 08/15/2017   SpO2 99%   BMI 27.46 kg/m   Physical Exam  Constitutional: She is oriented to person, place, and time. She appears well-developed and well-nourished.  HENT:  Head: Normocephalic and atraumatic.  Mouth/Throat: Uvula is midline, oropharynx is clear and moist and mucous membranes are normal. No oropharyngeal exudate, posterior oropharyngeal edema, posterior oropharyngeal erythema or tonsillar abscesses. Tonsils are 0 on the right. Tonsils are 0 on the left. No tonsillar exudate.  Eyes: Conjunctivae are normal. Pupils are equal, round, and reactive to light. Right eye exhibits no discharge. Left eye exhibits no discharge. No scleral icterus.  Neck: Normal range of motion. No JVD present. No tracheal deviation present.  Cardiovascular:  Normal rate, regular rhythm, normal heart sounds and intact distal pulses.  Pulmonary/Chest: Effort normal and breath sounds normal. No stridor. No respiratory distress. She has no wheezes. She has no rales.  Neurological: She is alert and oriented to person, place, and time. Coordination normal.  Psychiatric: She has a normal mood and affect. Her behavior is normal. Judgment and thought content normal.  Nursing note and vitals reviewed.    ED Treatments / Results  Labs (all labs ordered are listed, but only abnormal  results are displayed) Labs Reviewed  CBC WITH DIFFERENTIAL/PLATELET - Abnormal; Notable for the following components:      Result Value   WBC 10.6 (*)    All other components within normal limits  I-STAT BETA HCG BLOOD, ED ([facility], [facility], [facility] ONLY)    EKG  EKG Interpretation None       Radiology Dg Chest 2 View  Result Date: 08/25/2017 CLINICAL DATA:  Cough and fever. Chest pain. Shortness of breath. Left shoulder pain. EXAM: CHEST  2 VIEW COMPARISON:  None. FINDINGS: The heart size and mediastinal contours are within normal limits. Both lungs are clear except for slight peribronchial thickening. No effusions. Pectus excavatum deformity. IMPRESSION: Mild bronchitic changes. Electronically Signed   By: Francene BoyersJames  Maxwell M.D.   On: 08/25/2017 11:54    Procedures Procedures (including critical care time)  Medications Ordered in ED Medications - No data to display   Initial Impression / Assessment and Plan / ED Course  I have reviewed the triage vital signs and the nursing notes.  Pertinent labs & imaging results that were available during my care of the patient were reviewed by me and considered in my medical decision making (see chart for details).     Final Clinical Impressions(s) / ED Diagnoses   Final diagnoses:  Viral URI with cough    Labs: I stat chem, cbc,   Imaging: DG chest 2 view  Consults:  Therapeutics:  Discharge Meds: Flexeril  Assessment/Plan: 27 year old female presents today with likely viral URI.  Well-appearing no acute distress reassuring vital signs no significant laboratory abnormalities.  I have low suspicion for influenza in this patient.  Patient also with shoulder pain likely musculoskeletal, she will be given muscle relaxers, symptom medic care instructions and strict return precautions.  She verbalized understanding and agreement to today's plan had no further questions or   ED Discharge Orders        Ordered    cyclobenzaprine (FLEXERIL) 10 MG  tablet  2 times daily PRN     08/25/17 1307       Eyvonne MechanicHedges, Tesa Meadors, PA-C 08/25/17 1433    Rolland PorterJames, Mark, MD 08/26/17 2208

## 2017-08-30 ENCOUNTER — Emergency Department (HOSPITAL_BASED_OUTPATIENT_CLINIC_OR_DEPARTMENT_OTHER)
Admission: EM | Admit: 2017-08-30 | Discharge: 2017-08-30 | Disposition: A | Discharge status: Home / Self Care | Payer: Self-pay | Attending: Emergency Medicine | Admitting: Emergency Medicine

## 2017-08-30 ENCOUNTER — Encounter (HOSPITAL_BASED_OUTPATIENT_CLINIC_OR_DEPARTMENT_OTHER): Payer: Self-pay | Admitting: Emergency Medicine

## 2017-08-30 ENCOUNTER — Other Ambulatory Visit: Payer: Self-pay

## 2017-08-30 ENCOUNTER — Emergency Department (HOSPITAL_BASED_OUTPATIENT_CLINIC_OR_DEPARTMENT_OTHER): Payer: Self-pay

## 2017-08-30 DIAGNOSIS — M25512 Pain in left shoulder: Secondary | ICD-10-CM | POA: Insufficient documentation

## 2017-08-30 DIAGNOSIS — R05 Cough: Secondary | ICD-10-CM | POA: Insufficient documentation

## 2017-08-30 DIAGNOSIS — F17210 Nicotine dependence, cigarettes, uncomplicated: Secondary | ICD-10-CM | POA: Insufficient documentation

## 2017-08-30 DIAGNOSIS — R059 Cough, unspecified: Secondary | ICD-10-CM

## 2017-08-30 LAB — PREGNANCY, URINE: Preg Test, Ur: NEGATIVE

## 2017-08-30 MED ORDER — IBUPROFEN 800 MG PO TABS: 800.0000 mg | ORAL_TABLET | Freq: Three times a day (TID) | ORAL | 0 refills | Status: DC | PRN

## 2017-08-30 MED ORDER — TRAMADOL HCL 50 MG PO TABS: 50.0000 mg | ORAL_TABLET | Freq: Four times a day (QID) | ORAL | 0 refills | Status: DC | PRN

## 2017-08-30 MED FILL — traMADol HCL 50 MG TABS: 50 | 4 days supply | Qty: 15 | Fill #0

## 2017-08-30 MED FILL — IBUPROFEN 800 MG TAB: 800 | 7 days supply | Qty: 21 | Fill #0

## 2017-08-30 NOTE — ED Triage Notes (Signed)
Patient reports left shoulder pain x 1 week.  States seen previously for same and given RX for muscle relaxers without relief.

## 2017-08-30 NOTE — Discharge Instructions (Signed)

## 2017-08-30 NOTE — ED Provider Notes (Signed)
Emergency Department Provider Note   I have reviewed the triage vital signs and the nursing notes.   HISTORY  Chief Complaint Shoulder Pain   HPI Catherine Figueroa is a 27 y.o. female with PMH of GERD presents to the emergency department for evaluation of left shoulder pain which is worsening despite being prescribed Flexeril.  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  Since starting the job she is developed pain in the left shoulder with intermittent shooting pains into the arm and occasionally to the posterior shoulder.  Denies any midline neck pain.  During episodes of severe pain she has perceived weakness in the left hand that will momentarily have numb/pins and needles sensation which resolves after about 45 seconds.  No specific injury to the shoulder.  No fevers or chills.    Past Medical History:  Diagnosis Date  . GERD (gastroesophageal reflux disease)     Patient Active Problem List   Diagnosis Date Noted  . Left ankle injury, subsequent encounter 05/12/2017  . Esophageal reflux 11/14/2007  . Allergic rhinitis 10/20/2007    History reviewed. No pertinent surgical history.  Current Outpatient Rx  . Order #: [id number] Class: Print  . Order #: [id number] Class: Print  . Order #: [id number] Class: Print  . Order #: [id number] Class: Normal  . Order #: [id number] Class: Print    Allergies Patient has no known allergies.  History reviewed. No pertinent family history.  Social History Social History   Tobacco Use  . Smoking status: Current Every Day Smoker  . Smokeless tobacco: Never Used  Substance Use Topics  . Alcohol use: Yes  . Drug use: No    Review of Systems  Constitutional: No fever/chills Eyes: No visual changes. ENT: No sore throat. Cardiovascular: Denies chest pain. Respiratory: Denies shortness of breath. Gastrointestinal: No abdominal pain.  No nausea, no vomiting.  No diarrhea.  No  constipation. Genitourinary: Negative for dysuria. Musculoskeletal: Negative for back pain. Positive left shoulder pain.  Skin: Negative for rash. Neurological: Negative for headaches, focal weakness or numbness.  10-point ROS otherwise negative.  ____________________________________________   PHYSICAL EXAM:  VITAL SIGNS: ED Triage Vitals  Enc Vitals Group     BP 08/30/17 0802 139/79     Pulse Rate 08/30/17 0802 92     Resp 08/30/17 0802 16     Temp 08/30/17 0802 98.3 F (36.8 C)     Temp Source 08/30/17 0802 Oral     SpO2 08/30/17 0802 100 %     Weight 08/30/17 0803 165 lb (74.8 kg)     Height 08/30/17 0803 5\' 4"  (1.626 m)     Pain Score 08/30/17 0803 9   Constitutional: Alert and oriented. Well appearing and in no acute distress. Eyes: Conjunctivae are normal.  Head: Atraumatic. Nose: No congestion/rhinnorhea. Mouth/Throat: Mucous membranes are moist.  Neck: No stridor. No cervical spine tenderness to palpation. Cardiovascular: Normal rate, regular rhythm. Good peripheral circulation. Grossly normal heart sounds.   Respiratory: Normal respiratory effort.  No retractions. Lungs CTAB. Gastrointestinal: Soft and nontender. No distention.  Musculoskeletal: No lower extremity tenderness nor edema. No gross deformities of extremities. Pain with palpation over the posterior and anterior shoulder. Slight decreased grip strength in the left hand with obvious pain during exam.  Neurologic:  Normal speech and language. Normal sensation in the upper extremities. Decreased grip strength on the left with some pain.  Skin:  Skin is warm, dry and intact. No rash noted.  ____________________________________________   LABS (all labs ordered are listed, but only abnormal results are displayed)  Labs Reviewed  PREGNANCY, URINE   ____________________________________________  RADIOLOGY  Dg Chest 2 View  Result Date: 08/30/2017 CLINICAL DATA:  One week of left shoulder pain.  Previous similar episodes unresponsive to muscle relaxers. History of gastroesophageal reflux. Current smoker. EXAM: CHEST  2 VIEW COMPARISON:  Chest x-ray of August 25, 2017 FINDINGS: The lungs are adequately inflated. There is no pleural effusion. There is no infiltrate or atelectasis. The heart and pulmonary vascularity are normal. The mediastinum is normal in width. There is no pneumothorax or pneumomediastinum. The observed bony thorax exhibits no acute abnormality. IMPRESSION: There is no active cardiopulmonary disease. The observed portions of the bony thorax are unremarkable. Electronically Signed   By: David  [place]azil[place]Jordan M.D.   On: 08/30/2017 08:53   Dg Shoulder Left  Result Date: 08/30/2017 CLINICAL DATA:  One week of left shoulder pain. Similar episodes in the past unresponsive to muscle relaxers. EXAM: LEFT SHOULDER - 2+ VIEW COMPARISON:  None in PACs FINDINGS: The bones are subjectively adequately mineralized. The joint spaces are well maintained. There is no significant osteophyte formation. The subacromial subdeltoid space is reasonably well-maintained. IMPRESSION: There is no acute or significant chronic bony abnormality of the left shoulder. Electronically Signed   By: David  [place]Jordan M.D.   On: 08/30/2017 08:51    ____________________________________________   PROCEDURES  Procedure(s) performed:   Procedures  None ____________________________________________   INITIAL IMPRESSION / ASSESSMENT AND PLAN / ED COURSE  Pertinent labs & imaging results that were available during my care of the patient were reviewed by me and considered in my medical decision making (see chart for details).  Patient presents to the emergency department for evaluation of left shoulder pain that is worse with movement.  She has intermittent shooting pains into her hands which caused some weakness and numbness that resolved quickly.  She has slight decreased grip strength on my exam.  No midline spine  tenderness.  Doubt this is radicular pain.  No indication for emergent MRI of the cervical spine or brain.  Plan for plain film of the shoulder with continued supportive care.  Plan to refer the patient to sports medicine as an outpatient.  X-rays reviewed with no acute findings. Plan for discharge with supportive care, work note, and follow up info for outpatient sports medicine.   At this time, I do not feel there is any life-threatening condition present. I have reviewed and discussed all results (EKG, imaging, lab, urine as appropriate), exam findings with patient. I have reviewed nursing notes and appropriate previous records.  I feel the patient is safe to be discharged home without further emergent workup. Discussed usual and customary return precautions. Patient and family (if present) verbalize understanding and are comfortable with this plan.  Patient will follow-up with their primary care provider. If they do not have a primary care provider, information for follow-up has been provided to them. All questions have been answered.  ____________________________________________  FINAL CLINICAL IMPRESSION(S) / ED DIAGNOSES  Final diagnoses:  Acute pain of left shoulder  Cough    NEW OUTPATIENT MEDICATIONS STARTED DURING THIS VISIT:  Discharge Medication List as of 08/30/2017  9:16 AM    START taking these medications   Details  ibuprofen (ADVIL,MOTRIN) 800 MG tablet Take 1 tablet (800 mg total) by mouth every 8 (eight) hours as needed., Starting Mon 08/30/2017, Print    traMADol (ULTRAM) 50 MG tablet Take  1 tablet (50 mg total) by mouth every 6 (six) hours as needed., Starting Mon 08/30/2017, Print        Note:  This document was prepared using Dragon voice recognition software and may include unintentional dictation errors.  Alona Bene, MD Emergency Medicine    Long, Arlyss Repress, MD 08/30/17 1051

## 2017-12-30 ENCOUNTER — Encounter (HOSPITAL_COMMUNITY): Payer: Self-pay | Admitting: Emergency Medicine

## 2017-12-30 ENCOUNTER — Other Ambulatory Visit: Payer: Self-pay

## 2017-12-30 ENCOUNTER — Emergency Department (HOSPITAL_COMMUNITY)
Admission: EM | Admit: 2017-12-30 | Discharge: 2017-12-30 | Disposition: A | Discharge status: Home / Self Care | Payer: Self-pay | Attending: Emergency Medicine | Admitting: Emergency Medicine

## 2017-12-30 DIAGNOSIS — J029 Acute pharyngitis, unspecified: Secondary | ICD-10-CM | POA: Insufficient documentation

## 2017-12-30 DIAGNOSIS — Z79899 Other long term (current) drug therapy: Secondary | ICD-10-CM | POA: Insufficient documentation

## 2017-12-30 DIAGNOSIS — F17200 Nicotine dependence, unspecified, uncomplicated: Secondary | ICD-10-CM | POA: Insufficient documentation

## 2017-12-30 MED ORDER — PENICILLIN V POTASSIUM 500 MG PO TABS: 500.0000 mg | ORAL_TABLET | Freq: Four times a day (QID) | ORAL | 0 refills | Status: AC

## 2017-12-30 NOTE — ED Triage Notes (Signed)
Pt. Stated, I have 2 children at home with strep  Throat and last night I started having a sore throat and hard to swallow.

## 2017-12-30 NOTE — Discharge Instructions (Addendum)
Take tylenol and ibuprofen in addition to the Penicillin as needed for fever or pain.

## 2017-12-30 NOTE — ED Provider Notes (Signed)
[facility]   CSN: [id number] Arrival date & time: 12/30/17  1253     History   Chief Complaint Chief Complaint  Patient presents with  . Sore Throat    HPI Catherine SprungLaura Figueroa is a 27 y.o. female who presents to the ED with sore throat. Patient reports that 2 of her children and her husband tested positive for strep and are on antibiotics and last night she started with sore throat and fever. She denies cough.   HPI  Past Medical History:  Diagnosis Date  . GERD (gastroesophageal reflux disease)     Patient Active Problem List   Diagnosis Date Noted  . Left ankle injury, subsequent encounter 05/12/2017  . Esophageal reflux 11/14/2007  . Allergic rhinitis 10/20/2007    History reviewed. No pertinent surgical history.   OB History   None      Home Medications    Prior to Admission medications   Medication Sig Start Date End Date Taking? Authorizing Provider  cyclobenzaprine (FLEXERIL) 10 MG tablet Take 1 tablet (10 mg total) by mouth 2 (two) times daily as needed for muscle spasms. 08/25/17   Hedges, Tinnie GensJeffrey, PA-C  HYDROcodone-acetaminophen (NORCO) 5-325 MG tablet Take 1 tablet every 4 (four) hours as needed by mouth for moderate pain. 05/11/17   Hudnall, Azucena FallenShane R, MD  ibuprofen (ADVIL,MOTRIN) 800 MG tablet Take 1 tablet (800 mg total) by mouth every 8 (eight) hours as needed. 08/30/17   Long, Arlyss RepressJoshua G, MD  meloxicam (MOBIC) 7.5 MG tablet Take 1 tablet (7.5 mg total) daily by mouth. 05/11/17   Hudnall, Azucena FallenShane R, MD  penicillin v potassium (VEETID) 500 MG tablet Take 1 tablet (500 mg total) by mouth 4 (four) times daily for 10 days. 12/30/17 01/09/18  Janne NapoleonNeese, Zoua Caporaso M, NP  traMADol (ULTRAM) 50 MG tablet Take 1 tablet (50 mg total) by mouth every 6 (six) hours as needed. 08/30/17   Long, Arlyss RepressJoshua G, MD    Family History No family history on file.  Social History Social History   Tobacco Use  . Smoking status: Current Every Day  Smoker  . Smokeless tobacco: Never Used  Substance Use Topics  . Alcohol use: Yes  . Drug use: No     Allergies   Patient has no known allergies.   Review of Systems Review of Systems  Constitutional: Positive for chills and fever.  HENT: Positive for ear pain and sore throat. Negative for trouble swallowing.   Eyes: Negative for redness and itching.  Respiratory: Negative for cough.   Gastrointestinal: Negative for abdominal pain and vomiting.  Musculoskeletal: Positive for myalgias.  Skin: Negative for rash.  Neurological: Negative for headaches.  Hematological: Positive for adenopathy.     Physical Exam Updated Vital Signs BP 124/68 (BP Location: Right Arm)   Pulse 91   Temp 98.5 F (36.9 C) (Oral)   Resp 17   LMP 10/24/2017   SpO2 100%   Physical Exam  Constitutional: She appears well-developed and well-nourished. No distress.  HENT:  Head: Normocephalic.  Right Ear: Tympanic membrane normal.  Left Ear: Tympanic membrane normal.  Nose: Nose normal.  Mouth/Throat: Uvula is midline and mucous membranes are normal. Posterior oropharyngeal erythema present.  Eyes: Pupils are equal, round, and reactive to light. Conjunctivae and EOM are normal.  Neck: Neck supple.  Cardiovascular: Normal rate.  Pulmonary/Chest: Effort normal.  Abdominal: Soft. There is no tenderness.  Musculoskeletal: Normal range of motion.  Lymphadenopathy:  She has cervical adenopathy.  Neurological: She is alert.  Skin: Skin is warm and dry.  Psychiatric: She has a normal mood and affect. Her behavior is normal.  Nursing note and vitals reviewed.    ED Treatments / Results  Labs (all labs ordered are listed, but only abnormal results are displayed) Labs Reviewed - No data to display  Radiology No results found.  Procedures Procedures (including critical care time)  Medications Ordered in ED Medications - No data to display   Initial Impression / Assessment and Plan / ED  Course  I have reviewed the triage vital signs and the nursing notes. 27 y.o. female here with sore throat and reported fever that started last night. Patient's children and husband with positive strep screens. Will treat patient for strep. Offered Penicillin injection but patient declined and request oral antibiotics. Return precautions discussed.   Final Clinical Impressions(s) / ED Diagnoses   Final diagnoses:  Sore throat    ED Discharge Orders        Ordered    penicillin v potassium (VEETID) 500 MG tablet  4 times daily     12/30/17 1311       Damian Leavell [place], [place] 12/30/17 1325    Rolan Bucco, MD 12/30/17 1450

## 2018-07-22 IMAGING — DX DG FOOT COMPLETE 3+V*L*
3 series · 3 of 3 positions shown · non-contrast
Comparison: None.

CLINICAL DATA: Hyper dorsiflexion injury 1 week ago.  Pain.

EXAM:
LEFT FOOT - COMPLETE 3+ VIEW

[foot ap]
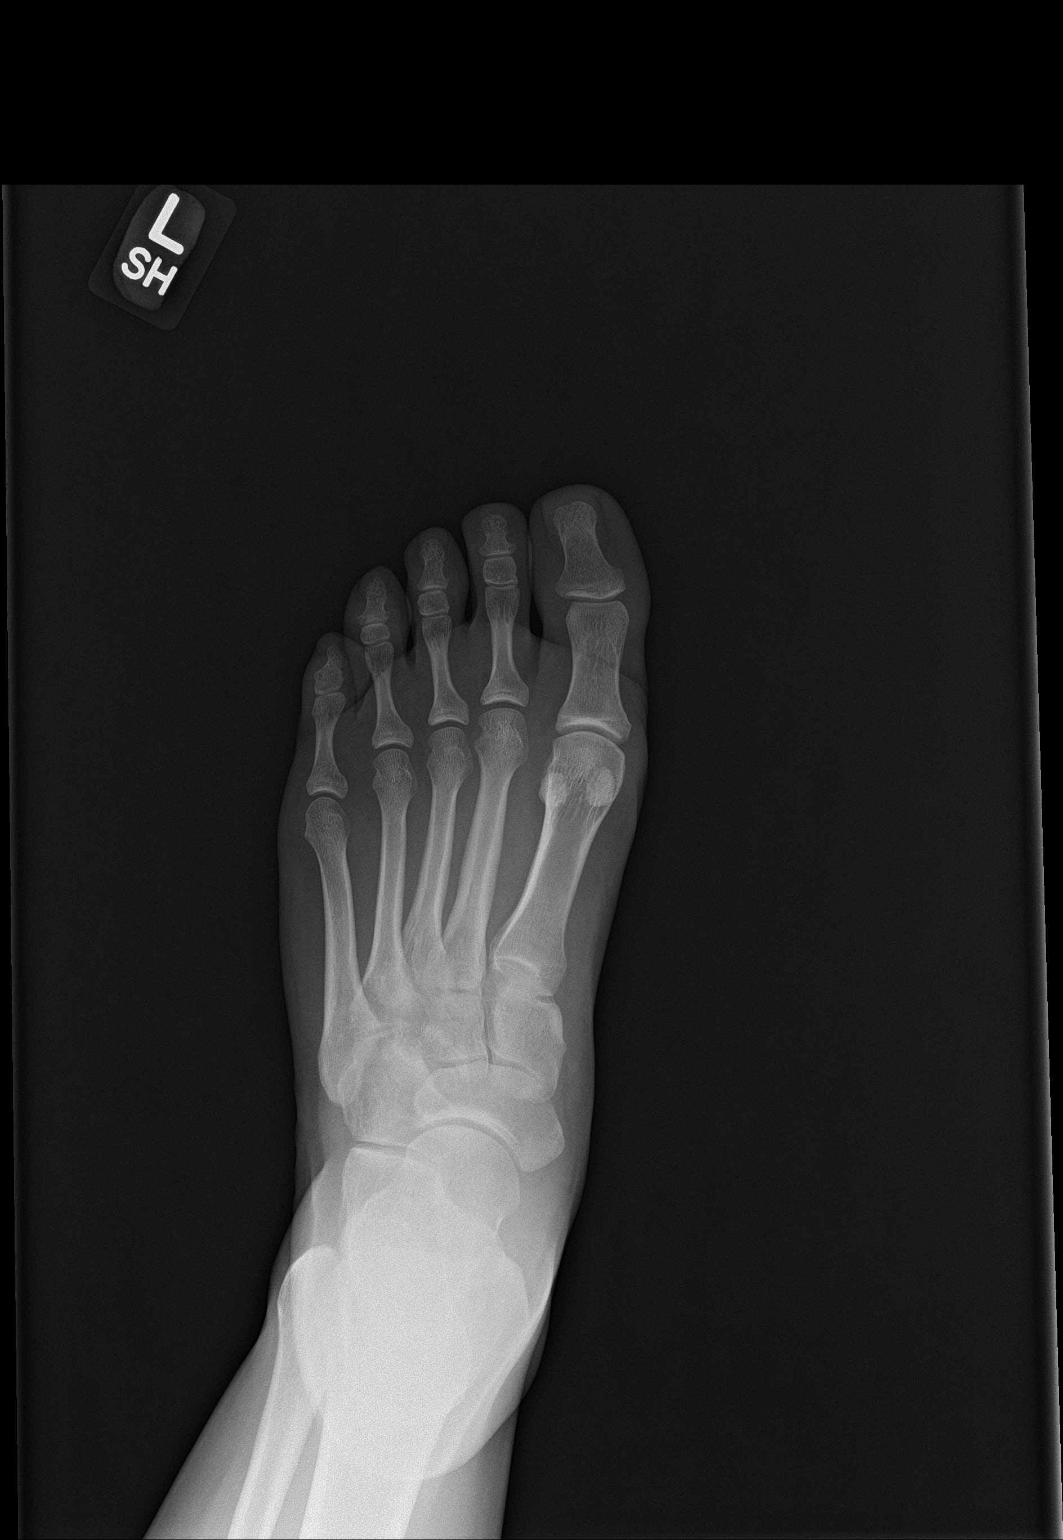

[foot obl]
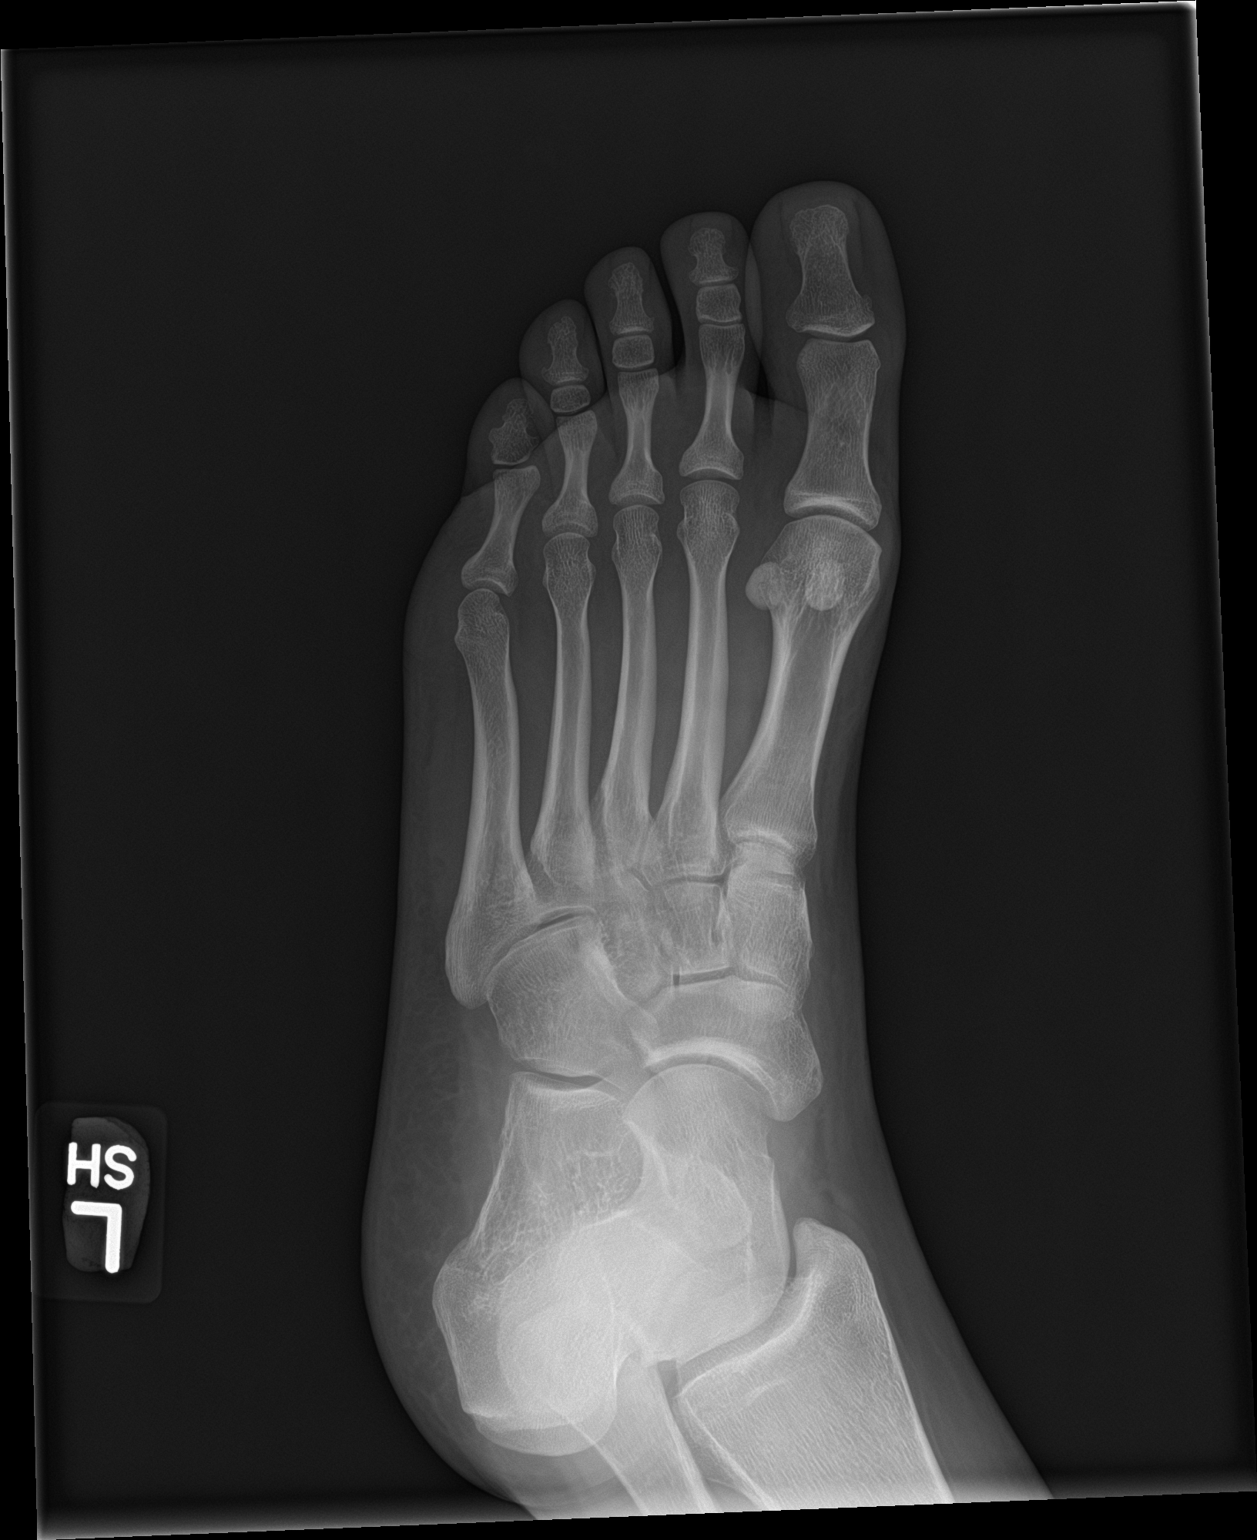

[foot lat]
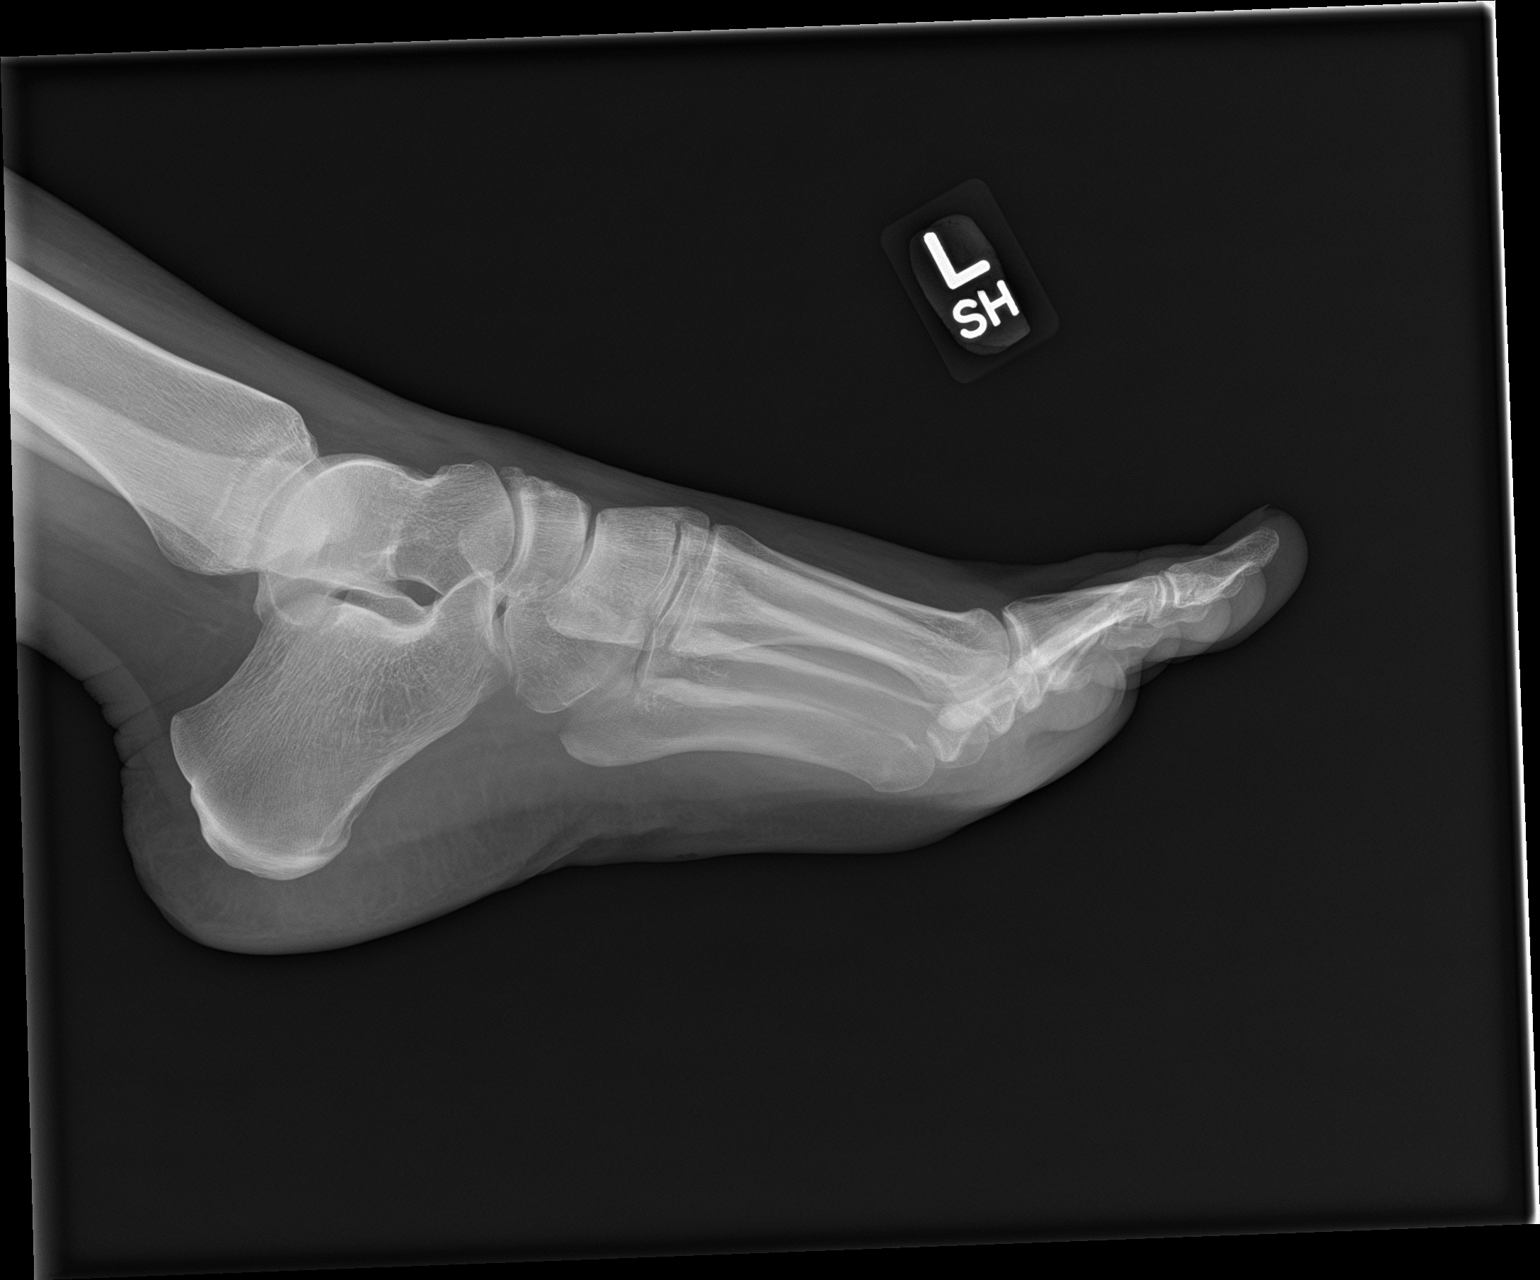

[3 of 3 positions shown; findings below may reference images not displayed]

FINDINGS: There is no evidence of fracture or dislocation. There is no
evidence of arthropathy or other focal bone abnormality. Soft
tissues are unremarkable.
IMPRESSION: Negative.

## 2018-07-22 IMAGING — DX DG ANKLE COMPLETE 3+V*L*
3 series · 3 of 3 positions shown · non-contrast
Comparison: 05/09/2017

CLINICAL DATA: Injury 1 week ago.  Pain.

EXAM:
LEFT ANKLE COMPLETE - 3+ VIEW

[ankle ap]
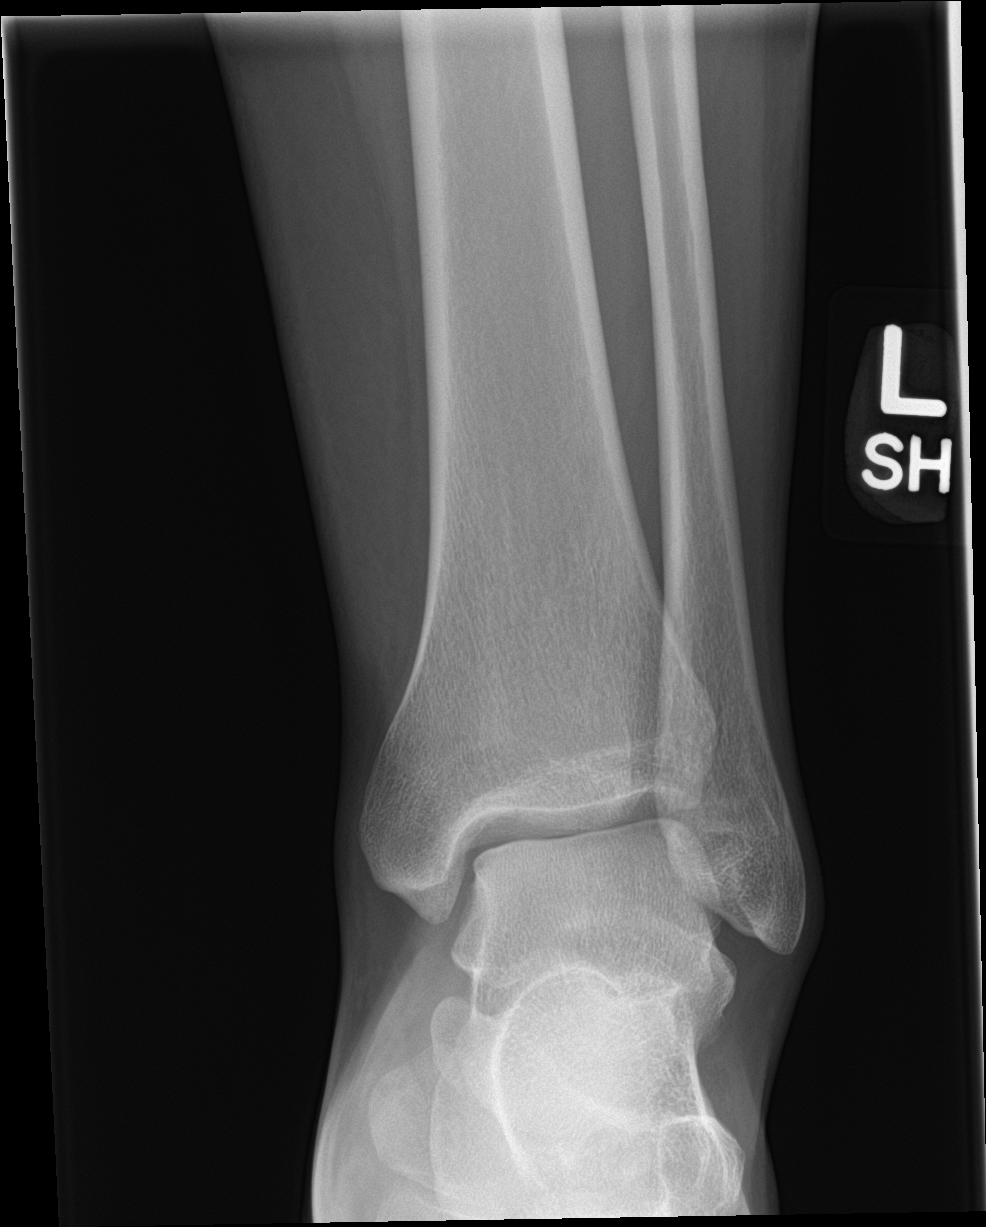

[ankle obl]
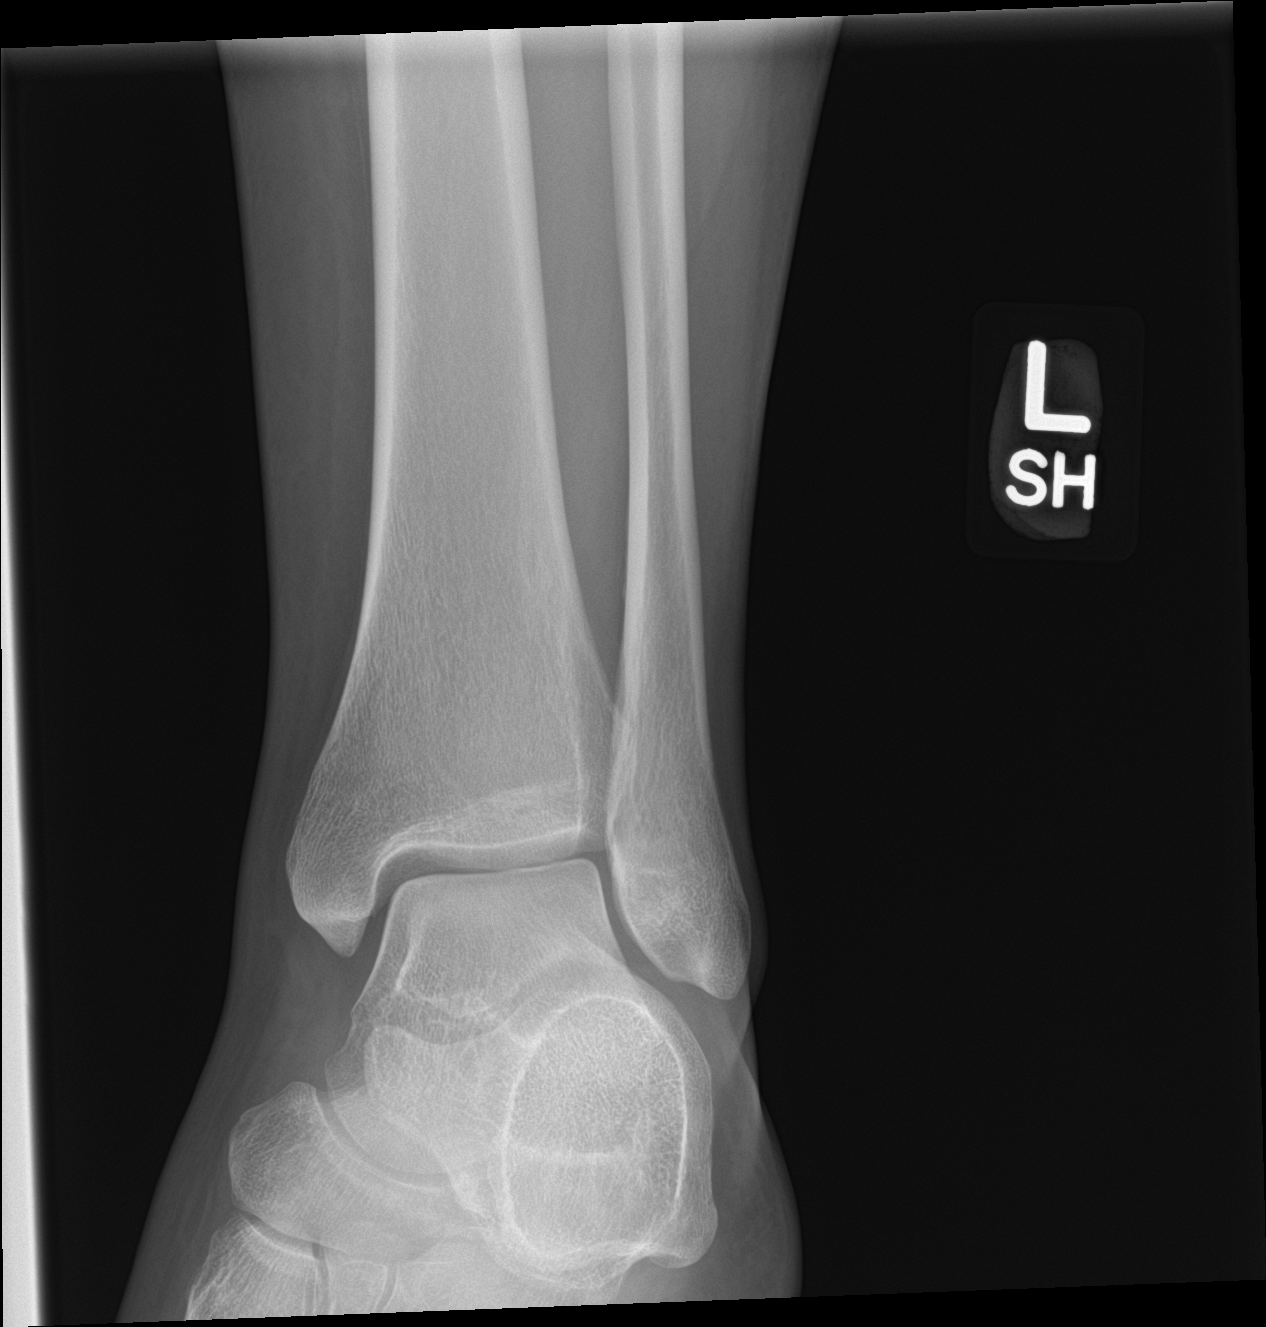

[ankle lat]
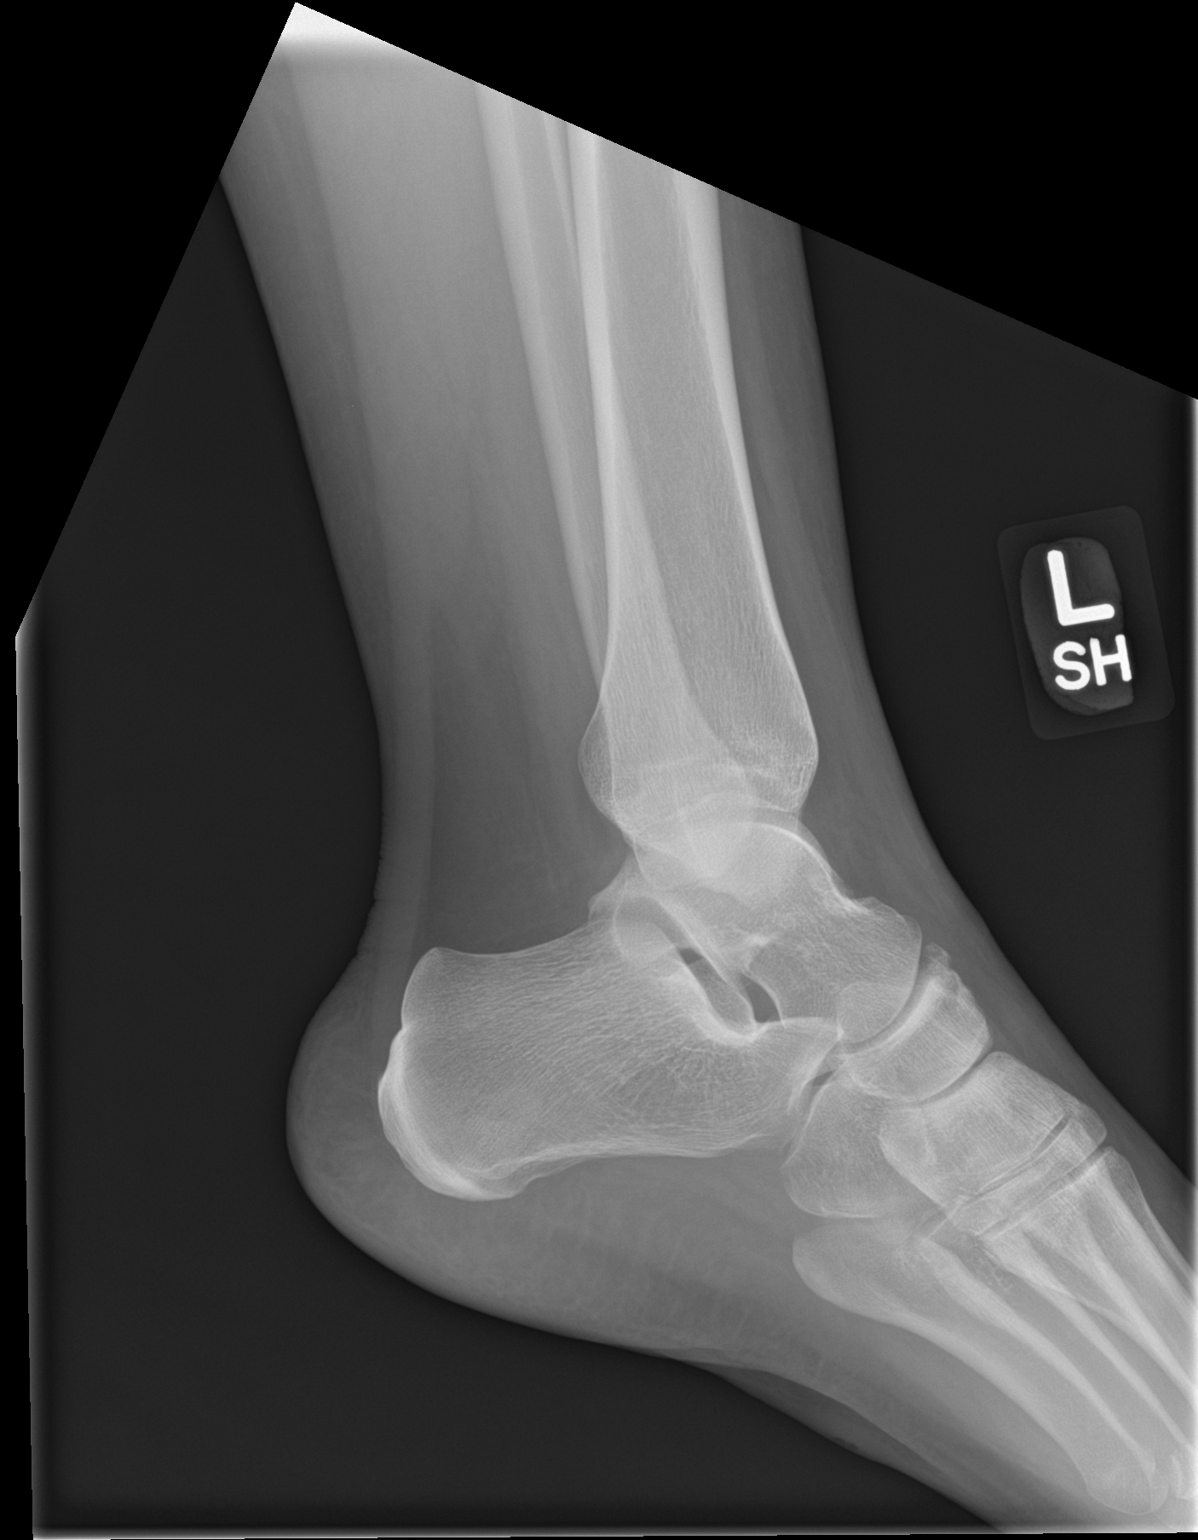

[3 of 3 positions shown; findings below may reference images not displayed]

FINDINGS: There is no evidence of fracture, dislocation, or joint effusion.
There is no evidence of arthropathy or other focal bone abnormality.
Soft tissues are unremarkable.
IMPRESSION: Negative.

## 2021-04-30 ENCOUNTER — Other Ambulatory Visit: Payer: Self-pay

## 2021-04-30 ENCOUNTER — Encounter (HOSPITAL_BASED_OUTPATIENT_CLINIC_OR_DEPARTMENT_OTHER): Payer: Self-pay

## 2021-04-30 ENCOUNTER — Emergency Department (HOSPITAL_BASED_OUTPATIENT_CLINIC_OR_DEPARTMENT_OTHER)
Admission: EM | Admit: 2021-04-30 | Discharge: 2021-04-30 | Disposition: A | Discharge status: Home / Self Care | Payer: Self-pay | Attending: Emergency Medicine | Admitting: Emergency Medicine

## 2021-04-30 DIAGNOSIS — Z20822 Contact with and (suspected) exposure to covid-19: Secondary | ICD-10-CM | POA: Insufficient documentation

## 2021-04-30 DIAGNOSIS — B349 Viral infection, unspecified: Secondary | ICD-10-CM | POA: Insufficient documentation

## 2021-04-30 DIAGNOSIS — F17200 Nicotine dependence, unspecified, uncomplicated: Secondary | ICD-10-CM | POA: Insufficient documentation

## 2021-04-30 LAB — CBC WITH DIFFERENTIAL/PLATELET
Abs Immature Granulocytes: 0.03 10*3/uL (ref 0.00–0.07)
Basophils Absolute: 0.1 10*3/uL (ref 0.0–0.1)
Basophils Relative: 1 %
Eosinophils Absolute: 0.2 10*3/uL (ref 0.0–0.5)
Eosinophils Relative: 2 %
HCT: 43.8 % (ref 36.0–46.0)
Hemoglobin: 14.6 g/dL (ref 12.0–15.0)
Immature Granulocytes: 0 %
Lymphocytes Relative: 26 %
Lymphs Abs: 2.8 10*3/uL (ref 0.7–4.0)
MCH: 30.4 pg (ref 26.0–34.0)
MCHC: 33.3 g/dL (ref 30.0–36.0)
MCV: 91.1 fL (ref 80.0–100.0)
Monocytes Absolute: 0.8 10*3/uL (ref 0.1–1.0)
Monocytes Relative: 8 %
Neutro Abs: 6.9 10*3/uL (ref 1.7–7.7)
Neutrophils Relative %: 63 %
Platelets: 241 10*3/uL (ref 150–400)
RBC: 4.81 MIL/uL (ref 3.87–5.11)
RDW: 12.3 % (ref 11.5–15.5)
WBC: 10.8 10*3/uL — ABNORMAL HIGH (ref 4.0–10.5)
nRBC: 0 % (ref 0.0–0.2)

## 2021-04-30 LAB — URINALYSIS, ROUTINE W REFLEX MICROSCOPIC
Bilirubin Urine: NEGATIVE
Glucose, UA: NEGATIVE mg/dL
Hgb urine dipstick: NEGATIVE
Ketones, ur: NEGATIVE mg/dL
Leukocytes,Ua: NEGATIVE
Nitrite: NEGATIVE
Protein, ur: NEGATIVE mg/dL
Specific Gravity, Urine: 1.007 (ref 1.005–1.030)
pH: 6 (ref 5.0–8.0)

## 2021-04-30 LAB — COMPREHENSIVE METABOLIC PANEL
ALT: 19 U/L (ref 0–44)
AST: 16 U/L (ref 15–41)
Albumin: 4.3 g/dL (ref 3.5–5.0)
Alkaline Phosphatase: 37 U/L — ABNORMAL LOW (ref 38–126)
Anion gap: 6 (ref 5–15)
BUN: 11 mg/dL (ref 6–20)
CO2: 28 mmol/L (ref 22–32)
Calcium: 9.4 mg/dL (ref 8.9–10.3)
Chloride: 103 mmol/L (ref 98–111)
Creatinine, Ser: 0.86 mg/dL (ref 0.44–1.00)
GFR, Estimated: >60 mL/min (ref >60)
Glucose, Bld: 97 mg/dL (ref 70–99)
Potassium: 4.1 mmol/L (ref 3.5–5.1)
Sodium: 137 mmol/L (ref 135–145)
Total Bilirubin: 0.3 mg/dL (ref 0.3–1.2)
Total Protein: 7.4 g/dL (ref 6.5–8.1)

## 2021-04-30 LAB — RESP PANEL BY RT-PCR (FLU A&B, COVID) ARPGX2
Influenza A by PCR: NEGATIVE
Influenza B by PCR: NEGATIVE
SARS Coronavirus 2 by RT PCR: NEGATIVE

## 2021-04-30 LAB — PREGNANCY, URINE: Preg Test, Ur: NEGATIVE

## 2021-04-30 MED ORDER — ONDANSETRON 4 MG PO TBDP: 4.0000 mg | ORAL_TABLET | Freq: Three times a day (TID) | ORAL | 0 refills | Status: DC | PRN

## 2021-04-30 MED ORDER — ONDANSETRON HCL 4 MG/2ML IJ SOLN
4.0000 mg | Freq: Once | INTRAMUSCULAR | Status: AC
  Administered 2021-04-30: 4 mg via INTRAVENOUS
  Filled 2021-04-30: qty 2

## 2021-04-30 MED ORDER — LACTATED RINGERS IV BOLUS
1000.0000 mL | Freq: Once | INTRAVENOUS | Status: AC
  Administered 2021-04-30: 1000 mL via INTRAVENOUS

## 2021-04-30 NOTE — ED Triage Notes (Signed)
Pt arrives POV wit c/o approximately 2 day history of generally not feeling well.  Describes nasal congestion, sinus pressure, headache, nausea, vomiting, and weakness.  Unsure if she has had any fevers.  Tried Tylenol which helped headache some.

## 2021-04-30 NOTE — ED Provider Notes (Signed)
[facility] EMERGENCY DEPARTMENT Provider Note  CSN: [id number] Arrival date & time: 04/30/21 0749    History Chief Complaint  Patient presents with   Nasal Congestion    Catherine Figueroa is a 30 y.o. female with no significant PMH reports she has been feeling sick for the last 2-3 days, nasal congestion, sinus pressure, cough with occasional sputum, nausea and vomiting. She think she may have been running a fever but has not checked. 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    Past Medical History:  Diagnosis Date   GERD (gastroesophageal reflux disease)     Past Surgical History:  Procedure Laterality Date   cord ablation  2008   cord ablation during pregnacy, cerclage   MOUTH SURGERY  2013   removed all teeth    No family history on file.  Social History   Tobacco Use   Smoking status: Every Day   Smokeless tobacco: Never  Vaping Use   Vaping Use: Never used  Substance Use Topics   Alcohol use: Yes    Comment: occ   Drug use: No     Home Medications Prior to Admission medications   Medication Sig Start Date End Date Taking? Authorizing Provider  ondansetron (ZOFRAN ODT) 4 MG disintegrating tablet Take 1 tablet (4 mg total) by mouth every 8 (eight) hours as needed for nausea or vomiting. 04/30/21  Yes Pollyann Savoy, MD     Allergies    Patient has no known allergies.   Review of Systems   Review of Systems A comprehensive review of systems was completed and negative except as noted in HPI.    Physical Exam BP 140/89 (BP Location: Right Arm)   Pulse 90   Temp 98.3 F (36.8 C) (Oral)   Resp 16   Ht 5\' 4"  (1.626 m)   Wt 91.6 kg   LMP 04/14/2021 (Approximate)   SpO2 100%   BMI 34.67 kg/m   Physical Exam Vitals and nursing note reviewed.  Constitutional:      Appearance: Normal appearance.  HENT:     Head: Normocephalic and atraumatic.     Nose: Congestion present.     Mouth/Throat:     Mouth: Mucous  membranes are moist.  Eyes:     Extraocular Movements: Extraocular movements intact.     Conjunctiva/sclera: Conjunctivae normal.  Cardiovascular:     Rate and Rhythm: Normal rate.  Pulmonary:     Effort: Pulmonary effort is normal.     Breath sounds: Normal breath sounds. No rhonchi or rales.  Abdominal:     General: Abdomen is flat.     Palpations: Abdomen is soft.     Tenderness: There is no abdominal tenderness.  Musculoskeletal:        General: No swelling. Normal range of motion.     Cervical back: Neck supple.  Skin:    General: Skin is warm and dry.  Neurological:     General: No focal deficit present.     Mental Status: She is alert.  Psychiatric:        Mood and Affect: Mood normal.     ED Results / Procedures / Treatments   Labs (all labs ordered are listed, but only abnormal results are displayed) Labs Reviewed  COMPREHENSIVE METABOLIC PANEL - Abnormal; Notable for the following components:      Result Value   Alkaline Phosphatase 37 (*)    All other components within normal  limits  CBC WITH DIFFERENTIAL/PLATELET - Abnormal; Notable for the following components:   WBC 10.8 (*)    All other components within normal limits  URINALYSIS, ROUTINE W REFLEX MICROSCOPIC - Abnormal; Notable for the following components:   APPearance HAZY (*)    Bacteria, UA RARE (*)    All other components within normal limits  RESP PANEL BY RT-PCR (FLU A&B, COVID) ARPGX2  PREGNANCY, URINE    EKG None   Radiology No results found.  Procedures Procedures  Medications Ordered in the ED Medications  lactated ringers bolus 1,000 mL (1,000 mLs Intravenous New Bag/Given 04/30/21 0837)  ondansetron (ZOFRAN) injection 4 mg (4 mg Intravenous Given 04/30/21 6237)     MDM Rules/Calculators/A&P MDM  Patient with viral symptoms, including nausea and vomiting, last emesis was earlier this morning. Will check labs, UA, Covid/Flu, IVF and antiemetics.   ED Course  I have reviewed  the triage vital signs and the nursing notes.  Pertinent labs & imaging results that were available during my care of the patient were reviewed by me and considered in my medical decision making (see chart for details).  Clinical Course as of 04/30/21 1052  Wed Apr 30, 2021  0850 CBC with mild leukocytosis, otherwise normal.  [CS]  0944 CBC is normal. Covid/Flu are neg.  [CS]  0953 UA, Hcg are neg.  [CS]  1011 Patient reports she is feeling some better. Will attempt PO trial.  [CS]  1050 Patient tolerating PO. Likely has a nonspecific viral illness. Plan discharge with Rx for Zofran as needed. Rest, oral hydration, PCP follow up. RTED for any other concerns.  [CS]    Clinical Course User Index [CS] Pollyann Savoy, MD    Final Clinical Impression(s) / ED Diagnoses Final diagnoses:  Viral syndrome    Rx / DC Orders ED Discharge Orders          Ordered    ondansetron (ZOFRAN ODT) 4 MG disintegrating tablet  Every 8 hours PRN        04/30/21 1052             Pollyann Savoy, MD 04/30/21 1052

## 2021-09-19 ENCOUNTER — Encounter (HOSPITAL_BASED_OUTPATIENT_CLINIC_OR_DEPARTMENT_OTHER): Payer: Self-pay | Admitting: Obstetrics and Gynecology

## 2021-09-19 ENCOUNTER — Emergency Department (HOSPITAL_BASED_OUTPATIENT_CLINIC_OR_DEPARTMENT_OTHER)
Admission: EM | Admit: 2021-09-19 | Discharge: 2021-09-19 | Disposition: A | Discharge status: Home / Self Care | Payer: Self-pay | Attending: Emergency Medicine | Admitting: Emergency Medicine

## 2021-09-19 ENCOUNTER — Other Ambulatory Visit: Payer: Self-pay

## 2021-09-19 DIAGNOSIS — J020 Streptococcal pharyngitis: Secondary | ICD-10-CM | POA: Insufficient documentation

## 2021-09-19 LAB — PREGNANCY, URINE: Preg Test, Ur: NEGATIVE

## 2021-09-19 LAB — GROUP A STREP BY PCR: Group A Strep by PCR: DETECTED — AB

## 2021-09-19 MED ORDER — AMOXICILLIN 500 MG PO CAPS: 500.0000 mg | ORAL_CAPSULE | Freq: Two times a day (BID) | ORAL | 0 refills | Status: AC

## 2021-09-19 MED ORDER — DEXAMETHASONE 4 MG PO TABS
6.0000 mg | ORAL_TABLET | Freq: Once | ORAL | Status: AC
  Administered 2021-09-19: 6 mg via ORAL
  Filled 2021-09-19: qty 2

## 2021-09-19 NOTE — ED Triage Notes (Signed)
Patient reports to the ER for sore throat for a few days, reports her daughter is positive for strep. Patient also reports she has been having significant nausea x1 month. LMP Feb. 17 and lasted 2 days.  ?

## 2021-09-19 NOTE — ED Provider Notes (Signed)
?[facility]PT ?Provider Note ? ? ?CSN: [id number] ?Arrival date & time: 09/19/21  0840 ? ?  ? ?History ? ?Chief Complaint  ?Patient presents with  ? Sore Throat  ? ? ?Catherine Figueroa is a 31 y.o. female. ? ?Patient presenting with chief complaint of sore throat.  Symptoms ongoing for 1 day today.  Positive subjective fevers.  Mild cough.  No vomiting or diarrhea.  Her child was diagnosed with strep throat just a few days ago and started on antibiotics.  Otherwise denies any vomiting or diarrhea no headache no chest pain abdominal pain. ? ? ?  ? ?Home Medications ?Prior to Admission medications   ?Medication Sig Start Date End Date Taking? Authorizing Provider  ?amoxicillin (AMOXIL) 500 MG capsule Take 1 capsule (500 mg total) by mouth 2 (two) times daily for 10 days. 09/19/21 09/29/21 Yes Cheryll Cockayne, MD  ?ondansetron (ZOFRAN ODT) 4 MG disintegrating tablet Take 1 tablet (4 mg total) by mouth every 8 (eight) hours as needed for nausea or vomiting. 04/30/21   Pollyann Savoy, MD  ?   ? ?Allergies    ?Patient has no known allergies.   ? ?Review of Systems   ?Review of Systems  ?Constitutional:  Positive for fever.  ?HENT:  Negative for ear pain.   ?Eyes:  Negative for pain.  ?Respiratory:  Negative for cough.   ?Cardiovascular:  Negative for chest pain.  ?Gastrointestinal:  Negative for abdominal pain.  ?Genitourinary:  Negative for flank pain.  ?Musculoskeletal:  Negative for back pain.  ?Skin:  Negative for rash.  ?Neurological:  Negative for headaches.  ? ?Physical Exam ?Updated Vital Signs ?BP (!) 141/94 (BP Location: Right Arm)   Pulse 99   Temp 98.4 ?F (36.9 ?C)   Resp 17   LMP 08/15/2021 (Exact Date)   SpO2 95%  ?Physical Exam ?Constitutional:   ?   General: She is not in acute distress. ?   Appearance: Normal appearance.  ?HENT:  ?   Head: Normocephalic.  ?   Nose: Nose normal.  ?   Mouth/Throat:  ?   Mouth: Mucous membranes are dry.  ?   Pharynx: Uvula midline.  ?   Comments: No  tonsillar swelling noted.  No uvular deviation noted mild erythema in the posterior pharynx.  No cobblestoning or exudate noted. ?Eyes:  ?   Extraocular Movements: Extraocular movements intact.  ?Cardiovascular:  ?   Rate and Rhythm: Normal rate.  ?Pulmonary:  ?   Effort: Pulmonary effort is normal.  ?Musculoskeletal:     ?   General: Normal range of motion.  ?   Cervical back: Normal range of motion.  ?Neurological:  ?   General: No focal deficit present.  ?   Mental Status: She is alert. Mental status is at baseline.  ? ? ?ED Results / Procedures / Treatments   ?Labs ?(all labs ordered are listed, but only abnormal results are displayed) ?Labs Reviewed  ?GROUP A STREP BY PCR - Abnormal; Notable for the following components:  ?    Result Value  ? Group A Strep by PCR DETECTED (*)   ? All other components within normal limits  ?PREGNANCY, URINE  ? ? ?EKG ?None ? ?Radiology ?No results found. ? ?Procedures ?Procedures  ? ? ?Medications Ordered in ED ?Medications  ?dexamethasone (DECADRON) tablet 6 mg (has no administration in time range)  ? ? ?ED Course/ Medical Decision Making/ A&P ?  ?                        ?  Medical Decision Making ?Amount and/or Complexity of Data Reviewed ?Labs: ordered. ? ? ?Rapid strep test is positive for strep throat. ? ?Patient given prescription of antibiotics, advised outpatient follow-up with her primary care doctor within 3 to 4 days.  Advised immediate return for worsening symptoms difficulty breathing or any additional concerns. ? ? ? ? ? ? ? ?Final Clinical Impression(s) / ED Diagnoses ?Final diagnoses:  ?Strep pharyngitis  ? ? ?Rx / DC Orders ?ED Discharge Orders   ? ?      Ordered  ?  amoxicillin (AMOXIL) 500 MG capsule  2 times daily       ? 09/19/21 1000  ? ?  ?  ? ?  ? ? ?  ?Cheryll Cockayne, MD ?09/19/21 1000 ? ?

## 2021-09-19 NOTE — Discharge Instructions (Addendum)
Your Strep test was positive.  Take the antibiotics as written. ? ?Call your primary care doctor or specialist as discussed in the next 2-3 days.   ?Return immediately back to the ER if: ? ?Your symptoms worsen within the next 12-24 hours. ?You develop new symptoms such as new fevers, persistent vomiting, new pain, shortness of breath, or new weakness or numbness, or if you have any other concerns. ? ?

## 2022-12-01 DIAGNOSIS — Z3689 Encounter for other specified antenatal screening: Secondary | ICD-10-CM | POA: Diagnosis not present

## 2022-12-01 DIAGNOSIS — O99210 Obesity complicating pregnancy, unspecified trimester: Secondary | ICD-10-CM | POA: Diagnosis not present

## 2022-12-01 DIAGNOSIS — O99341 Other mental disorders complicating pregnancy, first trimester: Secondary | ICD-10-CM | POA: Diagnosis not present

## 2022-12-01 DIAGNOSIS — N912 Amenorrhea, unspecified: Secondary | ICD-10-CM | POA: Diagnosis not present

## 2022-12-01 DIAGNOSIS — F419 Anxiety disorder, unspecified: Secondary | ICD-10-CM | POA: Diagnosis not present

## 2022-12-01 DIAGNOSIS — Z3201 Encounter for pregnancy test, result positive: Secondary | ICD-10-CM | POA: Diagnosis not present

## 2022-12-01 DIAGNOSIS — Z720 Tobacco use: Secondary | ICD-10-CM | POA: Diagnosis not present

## 2022-12-01 DIAGNOSIS — O0932 Supervision of pregnancy with insufficient antenatal care, second trimester: Secondary | ICD-10-CM | POA: Diagnosis not present

## 2022-12-01 DIAGNOSIS — O09899 Supervision of other high risk pregnancies, unspecified trimester: Secondary | ICD-10-CM | POA: Diagnosis not present

## 2022-12-01 DIAGNOSIS — Z3A15 15 weeks gestation of pregnancy: Secondary | ICD-10-CM | POA: Diagnosis not present

## 2022-12-01 DIAGNOSIS — O3680X0 Pregnancy with inconclusive fetal viability, not applicable or unspecified: Secondary | ICD-10-CM | POA: Diagnosis not present

## 2022-12-01 DIAGNOSIS — G930 Cerebral cysts: Secondary | ICD-10-CM | POA: Diagnosis not present

## 2022-12-08 LAB — PANORAMA PRENATAL TEST FULL PANEL:PANORAMA TEST PLUS 5 ADDITIONAL MICRODELETIONS: FETAL FRACTION: 8.6

## 2022-12-16 DIAGNOSIS — Z3A17 17 weeks gestation of pregnancy: Secondary | ICD-10-CM | POA: Diagnosis not present

## 2022-12-16 DIAGNOSIS — H6691 Otitis media, unspecified, right ear: Secondary | ICD-10-CM | POA: Diagnosis not present

## 2022-12-22 DIAGNOSIS — Z3A18 18 weeks gestation of pregnancy: Secondary | ICD-10-CM | POA: Diagnosis not present

## 2022-12-22 DIAGNOSIS — G930 Cerebral cysts: Secondary | ICD-10-CM | POA: Diagnosis not present

## 2022-12-22 DIAGNOSIS — O09212 Supervision of pregnancy with history of pre-term labor, second trimester: Secondary | ICD-10-CM | POA: Diagnosis not present

## 2022-12-22 DIAGNOSIS — O09899 Supervision of other high risk pregnancies, unspecified trimester: Secondary | ICD-10-CM | POA: Diagnosis not present

## 2022-12-22 DIAGNOSIS — Z3689 Encounter for other specified antenatal screening: Secondary | ICD-10-CM | POA: Diagnosis not present

## 2023-01-19 DIAGNOSIS — Z3689 Encounter for other specified antenatal screening: Secondary | ICD-10-CM | POA: Diagnosis not present

## 2023-01-19 DIAGNOSIS — O43102 Malformation of placenta, unspecified, second trimester: Secondary | ICD-10-CM | POA: Diagnosis not present

## 2023-01-19 DIAGNOSIS — O365920 Maternal care for other known or suspected poor fetal growth, second trimester, not applicable or unspecified: Secondary | ICD-10-CM | POA: Diagnosis not present

## 2023-01-19 DIAGNOSIS — Z3A22 22 weeks gestation of pregnancy: Secondary | ICD-10-CM | POA: Diagnosis not present

## 2023-03-17 DIAGNOSIS — O43103 Malformation of placenta, unspecified, third trimester: Secondary | ICD-10-CM | POA: Diagnosis not present

## 2023-03-17 DIAGNOSIS — Z3A30 30 weeks gestation of pregnancy: Secondary | ICD-10-CM | POA: Diagnosis not present

## 2023-03-17 DIAGNOSIS — Z3689 Encounter for other specified antenatal screening: Secondary | ICD-10-CM | POA: Diagnosis not present

## 2023-03-31 DIAGNOSIS — Z3689 Encounter for other specified antenatal screening: Secondary | ICD-10-CM | POA: Diagnosis not present

## 2023-04-09 DIAGNOSIS — O99210 Obesity complicating pregnancy, unspecified trimester: Secondary | ICD-10-CM | POA: Diagnosis not present

## 2023-04-16 DIAGNOSIS — O99210 Obesity complicating pregnancy, unspecified trimester: Secondary | ICD-10-CM | POA: Diagnosis not present

## 2023-04-22 DIAGNOSIS — Z6836 Body mass index (BMI) 36.0-36.9, adult: Secondary | ICD-10-CM | POA: Diagnosis not present

## 2023-04-22 DIAGNOSIS — O99213 Obesity complicating pregnancy, third trimester: Secondary | ICD-10-CM | POA: Diagnosis not present

## 2023-04-29 DIAGNOSIS — O99210 Obesity complicating pregnancy, unspecified trimester: Secondary | ICD-10-CM | POA: Diagnosis not present

## 2023-05-07 DIAGNOSIS — Z3689 Encounter for other specified antenatal screening: Secondary | ICD-10-CM | POA: Diagnosis not present

## 2023-05-07 DIAGNOSIS — O99213 Obesity complicating pregnancy, third trimester: Secondary | ICD-10-CM | POA: Diagnosis not present

## 2023-05-13 DIAGNOSIS — O99213 Obesity complicating pregnancy, third trimester: Secondary | ICD-10-CM | POA: Diagnosis not present

## 2023-05-13 DIAGNOSIS — Z6836 Body mass index (BMI) 36.0-36.9, adult: Secondary | ICD-10-CM | POA: Diagnosis not present

## 2023-05-18 DIAGNOSIS — O99214 Obesity complicating childbirth: Secondary | ICD-10-CM | POA: Diagnosis not present

## 2023-05-18 DIAGNOSIS — Z79899 Other long term (current) drug therapy: Secondary | ICD-10-CM | POA: Diagnosis not present

## 2023-05-18 DIAGNOSIS — O9081 Anemia of the puerperium: Secondary | ICD-10-CM | POA: Diagnosis not present

## 2023-05-18 DIAGNOSIS — Z3A39 39 weeks gestation of pregnancy: Secondary | ICD-10-CM | POA: Diagnosis not present

## 2023-05-19 DIAGNOSIS — Z3A39 39 weeks gestation of pregnancy: Secondary | ICD-10-CM | POA: Diagnosis not present

## 2023-05-19 DIAGNOSIS — O99213 Obesity complicating pregnancy, third trimester: Secondary | ICD-10-CM | POA: Diagnosis not present

## 2023-10-13 DIAGNOSIS — H5213 Myopia, bilateral: Secondary | ICD-10-CM | POA: Diagnosis not present

## 2023-11-05 DIAGNOSIS — R030 Elevated blood-pressure reading, without diagnosis of hypertension: Secondary | ICD-10-CM | POA: Diagnosis not present

## 2023-11-05 DIAGNOSIS — F419 Anxiety disorder, unspecified: Secondary | ICD-10-CM | POA: Diagnosis not present

## 2023-11-05 NOTE — Progress Notes (Signed)
 [facility] [place]  New Patient Visit Catherine Figueroa DOB: 1991/03/26  MRN: [id number] Visit Date: 11/05/2023  Encounter Provider: Bernardino JAYSON Level, FNP  Subjective:   Catherine Figueroa is a 33 y.o. female who presents to establish care in the practice.  Issues today include:  History of Present Illness The patient is a 33 year old female who presents for a new patient visit and to discuss paperwork, which includes a TB screening questionnaire.  Denies any sxs or concerns today.  She has been on a regimen of Zoloft 25 mg daily since 2024, primarily for the management of anxiety. She reports no current health issues and does not require any medication refills at this time.  She also takes birth control.  Her blood pressure was elevated at the initial check, measuring 131/101. She did not experience any known hypertension during her most recent pregnancy, which was uncomplicated, culminating in the birth of her third child in November 2024.  She is currently employed in childcare and also owns a business. She reports no physical conditions that could potentially hinder her ability to work with children. She has not traveled outside the [place]  for more than a month and has no compromised immune system. She does not use illicit drugs and has never been incarcerated. She has not been exposed to anyone with active tuberculosis. She reports no unexplained cough or fever lasting more than 3 weeks, night sweats, shortness of breath, chest pain, unintentional weight loss, or unexplained fatigue. She has not had a physical examination by a primary care physician in the past year.  SOCIAL HISTORY She does not use any illicit drugs. 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    Review of Systems Review of Systems  Constitutional:  Negative for activity change, appetite change, chills, diaphoresis, fatigue and fever.  HENT:  Negative for  congestion, ear pain, postnasal drip, rhinorrhea, sinus pressure, sinus pain, sneezing, sore throat and tinnitus.   Eyes:  Negative for photophobia, pain and redness.  Respiratory:  Negative for cough, chest tightness, shortness of breath and wheezing.   Cardiovascular:  Negative for chest pain, palpitations and leg swelling.  Gastrointestinal:  Negative for abdominal distention, abdominal pain, constipation, diarrhea, nausea and vomiting.  Endocrine: Negative for polydipsia, polyphagia and polyuria.  Genitourinary:  Negative for difficulty urinating, flank pain and frequency.  Musculoskeletal:  Negative for arthralgias, back pain, gait problem, joint swelling, neck pain and neck stiffness.  Skin:  Negative for color change, pallor and rash.  Allergic/Immunologic: Negative for environmental allergies, food allergies and immunocompromised state.  Neurological:  Negative for dizziness, light-headedness and headaches.  Hematological:  Negative for adenopathy. Does not bruise/bleed easily.  Psychiatric/Behavioral:  Negative for agitation.      Patient Active Problem List   Diagnosis Date Noted  Date Diagnosed   History of preterm delivery 01/22/2023     Resolved Problems  No resolved problems to display.    History reviewed. No pertinent past medical history.  Past Surgical History:  Procedure Laterality Date   CERVICAL CERCLAGE        No Known Allergies    Social History   Social History Narrative   Not on file    Family History  Problem Relation Name Age of Onset   Hypertension Mother     Thyroid disease Mother     Diabetes Father     ADD / ADHD Brother     Esophageal cancer Maternal Uncle  Ovarian cancer Paternal Aunt     Breast cancer Paternal Aunt     Diabetes Paternal Uncle     Diabetes Maternal Grandfather     Diabetes Paternal Grandfather        Objective:  BP (!) 131/101   Pulse 91   Temp 98.5 F (36.9 C) (Oral)   Resp 16   Ht 1.672  m (5' 5.83)   Wt 97.1 kg (214 lb)   LMP 10/24/2023   SpO2 99%   BMI 34.72 kg/m   Physical Exam Physical Exam Vitals and nursing note reviewed.  Constitutional:      Appearance: Normal appearance. She is obese.  HENT:     Head: Normocephalic and atraumatic.     Right Ear: Tympanic membrane, ear canal and external ear normal.     Left Ear: Tympanic membrane, ear canal and external ear normal.     Nose: Nose normal.     Mouth/Throat:     Mouth: Mucous membranes are moist.     Pharynx: Oropharynx is clear.  Eyes:     Extraocular Movements: Extraocular movements intact.     Conjunctiva/sclera: Conjunctivae normal.     Pupils: Pupils are equal, round, and reactive to light.  Cardiovascular:     Rate and Rhythm: Normal rate and regular rhythm.     Pulses: Normal pulses.     Heart sounds: Normal heart sounds.  Pulmonary:     Effort: Pulmonary effort is normal.     Breath sounds: Normal breath sounds.  Abdominal:     General: Abdomen is flat. Bowel sounds are normal.     Palpations: Abdomen is soft.  Musculoskeletal:        General: Normal range of motion.     Cervical back: Normal range of motion and neck supple.  Skin:    General: Skin is warm and dry.  Neurological:     General: No focal deficit present.     Mental Status: She is alert and oriented to person, place, and time. Mental status is at baseline.  Psychiatric:        Mood and Affect: Mood normal.        Behavior: Behavior normal.        Thought Content: Thought content normal.        Judgment: Judgment normal.        Labs: No results found for this or any previous visit (from the past week).  Assessment/Plan:   Assessment & Plan 1. Anxiety. - She has been taking Zoloft 25 mg daily since 12/2022 or 01/2023 for anxiety. - Reports no side effects from the medication. - Discussed medication effectiveness and confirmed no issues with current regimen. - Continue current medication regimen.  2. Elevated  blood pressure. - Initial blood pressure reading was 131/101, improved to 123/88 after resting. - No immediate intervention required, monitoring will continue. - Physical exam findings include normal pulse and abdominal examination. - Plan to recheck blood pressure at next visit.  3. Health maintenance. - Has not had a physical examination by a primary care physician in the past year. - Discussed scheduling a comprehensive physical examination. - Ordered annual physical examination for 01/2024. - Follow-up in 3 months for annual physical.  1. Anxiety (Primary)  2. Elevated BP without diagnosis of hypertension  Problem List Items Addressed This Visit   None Visit Diagnoses       Anxiety    -  Primary     Elevated BP without diagnosis  of hypertension          Please contact my office for worsening conditions or problems, and seek emergency medical treatment and/or call 911 if you or your family deems either necessary.  I have personally spent 30 minutes involved in face-to-face and non-face-to-face activities for this patient on the day of the visit.  Professional time spent includes the following activities, in addition to those noted in the documentation: Review of outside and prior records including labs, imaging, office notes, counseling of patient regarding nonpharmacologic and pharmacologic management of current conditions, diagnostic testing, differential diagnosis, goals of therapy and documentation.   No follow-ups on file.  Electronically signed by: Bernardino JAYSON Level, FNP 11/05/2023 11:18 AM

## 2024-02-22 DIAGNOSIS — R1110 Vomiting, unspecified: Secondary | ICD-10-CM | POA: Diagnosis not present

## 2024-02-22 DIAGNOSIS — R52 Pain, unspecified: Secondary | ICD-10-CM | POA: Diagnosis not present

## 2024-02-22 DIAGNOSIS — R112 Nausea with vomiting, unspecified: Secondary | ICD-10-CM | POA: Diagnosis not present

## 2024-02-22 DIAGNOSIS — B349 Viral infection, unspecified: Secondary | ICD-10-CM | POA: Diagnosis not present

## 2024-02-22 DIAGNOSIS — R0981 Nasal congestion: Secondary | ICD-10-CM | POA: Diagnosis not present

## 2024-02-22 NOTE — Progress Notes (Signed)
 Assessment and Plan:   Assessment & Plan Viral illness Education provided on symptom management.     Body aches  Orders:   POCT SARS/FLU/RSV - RN OBTAIN  Vomiting, unspecified vomiting type, unspecified whether nausea present  Orders:   POCT SARS/FLU/RSV - RN OBTAIN   POCT SARS/Flu/RSV  Nasal congestion  Orders:   fluticasone propionate (FLONASE) 50 mcg/actuation nasal spray; 1 spray into each nostril daily for 7 days.  Nausea and vomiting, unspecified vomiting type  Orders:   ondansetron  (ZOFRAN -ODT) 4 MG disintegrating tablet; Dissolve 1 tablet (4 mg total) in the mouth every eight (8) hours as needed for nausea for up to 7 days.     The following portions of the patient's history were reviewed and updated as appropriate: allergies, current medications, past family history, past medical history, past social history, past surgical history and problem list.   Subjective:   Patient ID: Catherine Figueroa is a 33 y.o. 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 The current episode started yesterday. The problem occurs intermittently. The problem has been unchanged. The pain is located in the generalized abdominal region. The quality of the pain is cramping. The abdominal pain does not radiate. Associated symptoms include diarrhea, headaches and vomiting. Pertinent negatives include no constipation, dysuria, hematochezia or melena. The pain is aggravated by eating. The pain is relieved by Nothing. She has tried nothing for the symptoms.  Diarrhea  This is a new problem. The current episode started yesterday. The problem occurs 5 to 10 times per day. The problem has been unchanged. Associated symptoms include abdominal  pain, chills, headaches and vomiting. Risk factors include ill contacts. She has tried nothing for the symptoms.  Emesis  This is a new problem. The current episode started yesterday. The problem occurs less than 2 times per day. The problem has been resolved. There has been no fever. Associated symptoms include abdominal pain, chills, diarrhea and headaches. Pertinent negatives include no chest pain.    ROS: Negative unless otherwise noted in HPI.  Objective:   Vital Signs:  BP 128/83 (BP Site: R Arm, BP Position: Sitting, BP Cuff Size: X-Large)   Pulse 75   Temp 36.8 C (98.3 F) (Oral)   Resp 18   Ht 158.8 cm (5' 2.5)   Wt 100.4 kg (221 lb 4.8 oz)   LMP 02/01/2024 (Exact Date)   SpO2 97%   Breastfeeding No   BMI 39.83 kg/m   Body mass index is 39.83 kg/m. Patient's last menstrual period was 02/01/2024 (exact date).  Results for orders placed or performed in visit on 02/22/24  POCT SARS/Flu/RSV  Result Value Ref Range   POCT SARS-CoV-2 NAA Negative Negative   Influenza A Negative Negative   Influenza B Negative Negative   POC Rapid RSV, NAA Negative Negative   Operator ID Gretta Cower     Physical Exam Vitals and nursing note reviewed.  Constitutional:      General: She is not in acute distress.    Appearance: Normal appearance. She is normal weight. She is not ill-appearing or toxic-appearing.  HENT:     Head: Normocephalic.     Right Ear: Tympanic membrane, ear  canal and external ear normal.     Left Ear: Tympanic membrane, ear canal and external ear normal.     Nose: Congestion and rhinorrhea present.     Mouth/Throat:     Mouth: Mucous membranes are moist.     Pharynx: Oropharynx is clear.  Eyes:     Extraocular Movements: Extraocular movements intact.     Conjunctiva/sclera: Conjunctivae normal.     Pupils: Pupils are equal, round, and reactive to light.  Cardiovascular:     Rate and Rhythm: Normal rate and regular rhythm.     Heart sounds: Normal heart  sounds.  Pulmonary:     Effort: Pulmonary effort is normal. No respiratory distress.     Breath sounds: Normal breath sounds.  Abdominal:     General: Abdomen is flat.     Palpations: Abdomen is soft. There is no mass.     Tenderness: There is abdominal tenderness in the left lower quadrant. There is no right CVA tenderness, left CVA tenderness or guarding.     Hernia: No hernia is present.   Musculoskeletal:        General: Normal range of motion.     Cervical back: Normal range of motion.  Skin:    General: Skin is warm and dry.     Findings: No rash.  Neurological:     General: No focal deficit present.     Mental Status: She is alert and oriented to person, place, and time. Mental status is at baseline.  Psychiatric:        Mood and Affect: Mood normal.        Behavior: Behavior normal.        Thought Content: Thought content normal.        Judgment: Judgment normal.      Follow-up as Needed  and Follow-up with PCP  Disposition Upon Discharge:  1.  Routine symptom specific, illness specific and/or disease specific instructions were discussed with the patient and/or caregiver at length.  2.  The differential diagnosis for the patient's specific symptoms were reviewed and discussed with the patient/caregiver at length; the patient/caregiver expressed understanding of all discussed and their relevant questions were all satisfactorily answered.  3.  Return to care should the presenting symptoms recur, persist, or worsen in any way; or in the alternative, if new symptoms or complaints develop.  4.  The patient and any family present were given verbal and/or written discharge instructions as clinically indicated and appropriate.  5.  Continue OTC medications as needed and tolerated for control of the currently reported symptoms, if no conflict with the currently prescribed medications.

## 2024-02-22 NOTE — Progress Notes (Signed)
 Tobacco Use: Medium Risk (02/22/2024)   Patient History    Smoking Tobacco Use: Former    Smokeless Tobacco Use: Never    Passive Exposure: Current

## 2024-05-15 DIAGNOSIS — R070 Pain in throat: Secondary | ICD-10-CM | POA: Diagnosis not present

## 2024-05-15 DIAGNOSIS — N912 Amenorrhea, unspecified: Secondary | ICD-10-CM | POA: Diagnosis not present

## 2024-05-15 DIAGNOSIS — J069 Acute upper respiratory infection, unspecified: Secondary | ICD-10-CM | POA: Diagnosis not present

## 2024-05-15 DIAGNOSIS — B9689 Other specified bacterial agents as the cause of diseases classified elsewhere: Secondary | ICD-10-CM | POA: Diagnosis not present

## 2024-05-15 NOTE — Progress Notes (Signed)
 Assessment and Plan:   Assessment & Plan Bacterial URI Acute upper respiratory infection with sore throat and cough Acute upper respiratory infection with congestion, pressure, cough, and sore throat. Mild fever and chills noted. Pregnancy test ordered for medications.  - Ordered pregnancy test.  - Advised increased fluid intake, vitamin C, honey, and warm tea. - Recommended Flonase for congestion and vapor rub for chest. - Provided work note for three days off. Orders:   amoxicillin -clavulanate (AUGMENTIN) 875-125 mg per tablet; Take 1 tablet by mouth every twelve (12) hours for 10 days.  Throat pain  Orders:   POCT Rapid Strep A Screen - RN Obtain   POCT SARS/FLU/RSV - RN OBTAIN  Amenorrhea  Orders:   POCT Pregnancy Urine, Qualitative Pregnancy test is positive.  Patient notified and instructed to follow-up with OB/GYN.  Patient verbalized understanding.     The following portions of the patient's history were reviewed and updated as appropriate: allergies, current medications, past family history, past medical history, past social history, past surgical history and problem list.   Subjective:   Patient ID: Catherine Figueroa is a 33 y.o. female Chief Complaint  Patient presents with   Sinusitis    Patient has had congestion, sinus pressure, cough, and sore throat for several days.      History of Present Illness Catherine Figueroa is a 33 year old female who presents with congestion, pressure cough, and sore throat for several days.She experiences congestion, pressure cough primarily in the sinus area, and sore throat for several days. Body aches, slight fever of 100.650F to 100.50F, and chills occurred overnight. She took Tylenol  for a headache earlier today. She has mild diarrhea and nausea without vomiting. No medication allergies. 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   Sinusitis This is a new problem. The current episode started in  the past 7 days. The problem has been gradually worsening since onset. The maximum temperature recorded prior to her arrival was 100.4 - 100.9 F. Associated symptoms include chills, congestion, coughing, headaches, sinus pressure and a sore throat. Pertinent negatives include no shortness of breath. Past treatments include acetaminophen .    ROS: Negative unless otherwise noted in HPI.  Objective:   Vital Signs:  BP 123/82 (BP Position: Sitting)   Pulse 97   Temp 36.7 C (98.1 F)   Resp 18   Ht 165.1 cm (5' 5)   Wt 90.7 kg (200 lb)   LMP 02/01/2024 (Exact Date)   SpO2 100%   Breastfeeding Unknown   BMI 33.28 kg/m   Body mass index is 33.28 kg/m. Patient's last menstrual period was 02/01/2024 (exact date).  Results for orders placed or performed in visit on 05/15/24  POCT Pregnancy Urine, Qualitative  Result Value Ref Range   HCG Urine, POC Positive (A) Negative, Invalid   Quality Control Internal, HCG Urine POC QC Acceptable    URINE PREGNANCY LOT NUMBER [id number]    URINE PREGNANCY EXPIRATION DATE 05/28/2025   POCT SARS/Flu/RSV  Result Value Ref Range   POCT SARS-CoV-2 NAA Negative Negative   Influenza A Negative Negative   Influenza B Negative Negative   POC Rapid RSV, NAA Negative Negative     Physical Exam Vitals and nursing note reviewed.  Constitutional:      General: She is not in acute distress.    Appearance: Normal appearance. She is normal weight. She is not ill-appearing or toxic-appearing.  HENT:     Head: Normocephalic.  Right Ear: Tympanic membrane, ear canal and external ear normal.     Left Ear: Tympanic membrane, ear canal and external ear normal.     Nose: Congestion and rhinorrhea present.     Right Sinus: Maxillary sinus tenderness present.     Left Sinus: Maxillary sinus tenderness present.     Mouth/Throat:     Mouth: Mucous membranes are moist.     Pharynx: Oropharynx is clear.  Eyes:     Extraocular Movements: Extraocular movements  intact.     Conjunctiva/sclera: Conjunctivae normal.     Pupils: Pupils are equal, round, and reactive to light.  Cardiovascular:     Rate and Rhythm: Normal rate and regular rhythm.     Heart sounds: Normal heart sounds.  Pulmonary:     Effort: Pulmonary effort is normal. No respiratory distress.     Breath sounds: Normal breath sounds.  Abdominal:     General: Abdomen is flat.     Palpations: Abdomen is soft.  Musculoskeletal:        General: Normal range of motion.     Cervical back: Normal range of motion.  Skin:    General: Skin is warm and dry.     Findings: No rash.  Neurological:     General: No focal deficit present.     Mental Status: She is alert and oriented to person, place, and time. Mental status is at baseline.  Psychiatric:        Mood and Affect: Mood normal.        Behavior: Behavior normal.        Thought Content: Thought content normal.        Judgment: Judgment normal.       PHQ-2 Score: 0  PHQ-9 Score:    Screening complete, no depression identified / no further action needed today   Follow-up as Needed  and Follow-up with PCP  The use of abridge was used to help in the completion of this note. Patient was educated and verbalized consent of its use.   Disposition Upon Discharge:  1.  Routine symptom specific, illness specific and/or disease specific instructions were discussed with the patient and/or caregiver at length.  2.  The differential diagnosis for the patient's specific symptoms were reviewed and discussed with the patient/caregiver at length; the patient/caregiver expressed understanding of all discussed and their relevant questions were all satisfactorily answered.  3.  Return to care should the presenting symptoms recur, persist, or worsen in any way; or in the alternative, if new symptoms or complaints develop.  4.  The patient and any family present were given verbal and/or written discharge instructions as clinically indicated and  appropriate.  5.  Continue OTC medications as needed and tolerated for control of the currently reported symptoms, if no conflict with the currently prescribed medications.

## 2024-06-29 NOTE — L&D Delivery Note (Cosign Needed)
 [facility] Delivery Note  Catherine Figueroa is a 34 y.o. [phone] s/p SVD at [redacted]w[redacted]d. She was admitted for SOL.   ROM: rupture date, rupture time, delivery date, or delivery time have not been documented GBS Status: unknown, Ancef  given Maximum Maternal Temperature: 13F  Labor Progress: Initial SVE 4/70/-3, progressed expectantly to fully dilated/+3  Delivery Date/Time: 07/23/24 0322 Delivery: Called to room and patient was complete and pushing. Head delivered MOP. Nuchal cord x1, reduced during delivery. Shoulder and body delivered in usual fashion. Infant with spontaneous cry, placed on mother's abdomen, dried and stimulated. Cord clamped x 2 after 1-minute delay, and cut by father of baby. Cord blood drawn. Placenta delivered spontaneously, intact, with 3-vessel cord. Fundus firm with massage and Pitocin . Labia, perineum, vagina, and cervix inspected, no lacerations found.   Placenta: Intact, delivered spontaneously Complications: None Lacerations: None EBL: Analgesia: Epidural  Infant: Viable female  APGARs 9,9  2863g  Catherine DELENA Courts, MD 07/23/2024, 4:26 AM

## 2024-07-22 ENCOUNTER — Encounter (HOSPITAL_BASED_OUTPATIENT_CLINIC_OR_DEPARTMENT_OTHER): Payer: Self-pay

## 2024-07-22 ENCOUNTER — Other Ambulatory Visit: Payer: Self-pay

## 2024-07-22 ENCOUNTER — Inpatient Hospital Stay (HOSPITAL_COMMUNITY)

## 2024-07-22 ENCOUNTER — Inpatient Hospital Stay (HOSPITAL_BASED_OUTPATIENT_CLINIC_OR_DEPARTMENT_OTHER)
Admission: EM | Admit: 2024-07-22 | Discharge: 2024-07-25 | DRG: 806 | Disposition: A | Attending: Obstetrics & Gynecology | Admitting: Obstetrics & Gynecology

## 2024-07-22 DIAGNOSIS — K219 Gastro-esophageal reflux disease without esophagitis: Secondary | ICD-10-CM | POA: Diagnosis present

## 2024-07-22 DIAGNOSIS — O4703 False labor before 37 completed weeks of gestation, third trimester: Secondary | ICD-10-CM

## 2024-07-22 DIAGNOSIS — O9081 Anemia of the puerperium: Secondary | ICD-10-CM | POA: Diagnosis not present

## 2024-07-22 DIAGNOSIS — Z3687 Encounter for antenatal screening for uncertain dates: Secondary | ICD-10-CM | POA: Diagnosis not present

## 2024-07-22 DIAGNOSIS — R030 Elevated blood-pressure reading, without diagnosis of hypertension: Secondary | ICD-10-CM | POA: Diagnosis present

## 2024-07-22 DIAGNOSIS — O0933 Supervision of pregnancy with insufficient antenatal care, third trimester: Secondary | ICD-10-CM | POA: Diagnosis not present

## 2024-07-22 DIAGNOSIS — Z3A35 35 weeks gestation of pregnancy: Secondary | ICD-10-CM

## 2024-07-22 DIAGNOSIS — O26893 Other specified pregnancy related conditions, third trimester: Secondary | ICD-10-CM | POA: Diagnosis present

## 2024-07-22 DIAGNOSIS — O09893 Supervision of other high risk pregnancies, third trimester: Secondary | ICD-10-CM

## 2024-07-22 DIAGNOSIS — D62 Acute posthemorrhagic anemia: Secondary | ICD-10-CM | POA: Diagnosis not present

## 2024-07-22 DIAGNOSIS — O9962 Diseases of the digestive system complicating childbirth: Secondary | ICD-10-CM | POA: Diagnosis present

## 2024-07-22 DIAGNOSIS — O99334 Smoking (tobacco) complicating childbirth: Secondary | ICD-10-CM | POA: Diagnosis present

## 2024-07-22 DIAGNOSIS — M549 Dorsalgia, unspecified: Secondary | ICD-10-CM | POA: Diagnosis present

## 2024-07-22 DIAGNOSIS — R809 Proteinuria, unspecified: Secondary | ICD-10-CM | POA: Insufficient documentation

## 2024-07-22 DIAGNOSIS — O163 Unspecified maternal hypertension, third trimester: Secondary | ICD-10-CM | POA: Insufficient documentation

## 2024-07-22 DIAGNOSIS — O479 False labor, unspecified: Principal | ICD-10-CM

## 2024-07-22 LAB — CBC WITH DIFFERENTIAL/PLATELET
Abs Immature Granulocytes: 0.1 10*3/uL — ABNORMAL HIGH (ref 0.00–0.07)
Basophils Absolute: 0 10*3/uL (ref 0.0–0.1)
Basophils Relative: 0 %
Eosinophils Absolute: 0 10*3/uL (ref 0.0–0.5)
Eosinophils Relative: 0 %
HCT: 35.2 % — ABNORMAL LOW (ref 36.0–46.0)
Hemoglobin: 12 g/dL (ref 12.0–15.0)
Immature Granulocytes: 1 %
Lymphocytes Relative: 16 %
Lymphs Abs: 2.5 10*3/uL (ref 0.7–4.0)
MCH: 30.4 pg (ref 26.0–34.0)
MCHC: 34.1 g/dL (ref 30.0–36.0)
MCV: 89.1 fL (ref 80.0–100.0)
Monocytes Absolute: 0.9 10*3/uL (ref 0.1–1.0)
Monocytes Relative: 6 %
Neutro Abs: 11.9 10*3/uL — ABNORMAL HIGH (ref 1.7–7.7)
Neutrophils Relative %: 77 %
Platelets: 244 10*3/uL (ref 150–400)
RBC: 3.95 MIL/uL (ref 3.87–5.11)
RDW: 14.3 % (ref 11.5–15.5)
WBC: 15.6 10*3/uL — ABNORMAL HIGH (ref 4.0–10.5)
nRBC: 0 % (ref 0.0–0.2)

## 2024-07-22 LAB — URINALYSIS, ROUTINE W REFLEX MICROSCOPIC
Bilirubin Urine: NEGATIVE
Glucose, UA: NEGATIVE mg/dL
Ketones, ur: NEGATIVE mg/dL
Nitrite: NEGATIVE
Protein, ur: 30 mg/dL — AB
Specific Gravity, Urine: <1.005 — ABNORMAL LOW (ref 1.005–1.030)
pH: 7 (ref 5.0–8.0)

## 2024-07-22 LAB — HCG, QUANTITATIVE, PREGNANCY: hCG, Beta Chain, Quant, S: 18738 m[IU]/mL — ABNORMAL HIGH

## 2024-07-22 LAB — COMPREHENSIVE METABOLIC PANEL WITH GFR
ALT: 11 U/L (ref 0–44)
AST: 17 U/L (ref 15–41)
Albumin: 4 g/dL (ref 3.5–5.0)
Alkaline Phosphatase: 126 U/L (ref 38–126)
Anion gap: 14 (ref 5–15)
BUN: 7 mg/dL (ref 6–20)
CO2: 20 mmol/L — ABNORMAL LOW (ref 22–32)
Calcium: 9.8 mg/dL (ref 8.9–10.3)
Chloride: 101 mmol/L (ref 98–111)
Creatinine, Ser: 0.56 mg/dL (ref 0.44–1.00)
GFR, Estimated: >60 mL/min
Glucose, Bld: 87 mg/dL (ref 70–99)
Potassium: 3.5 mmol/L (ref 3.5–5.1)
Sodium: 135 mmol/L (ref 135–145)
Total Bilirubin: 0.4 mg/dL (ref 0.0–1.2)
Total Protein: 7.5 g/dL (ref 6.5–8.1)

## 2024-07-22 LAB — PREGNANCY, URINE: Preg Test, Ur: POSITIVE — AB

## 2024-07-22 LAB — GLUCOSE, CAPILLARY: Glucose-Capillary: 79 mg/dL (ref 70–99)

## 2024-07-22 LAB — RAPID HIV SCREEN (HIV 1/2 AB+AG)
HIV 1/2 Antibodies: NONREACTIVE
HIV-1 P24 Antigen - HIV24: NONREACTIVE

## 2024-07-22 MED ORDER — ONDANSETRON HCL 4 MG/2ML IJ SOLN: 4.0000 mg | Freq: Four times a day (QID) | INTRAMUSCULAR | Status: DC | PRN

## 2024-07-22 MED ORDER — LACTATED RINGERS IV SOLN: INTRAVENOUS | Status: DC

## 2024-07-22 MED ORDER — SODIUM CHLORIDE 0.9 % IV SOLN: 5.0000 10*6.[IU] | Freq: Once | INTRAVENOUS | Status: DC

## 2024-07-22 MED ORDER — ACETAMINOPHEN 325 MG PO TABS: 650.0000 mg | ORAL_TABLET | ORAL | Status: DC | PRN

## 2024-07-22 MED ORDER — CEFAZOLIN SODIUM-DEXTROSE 2-4 GM/100ML-% IV SOLN
2.0000 g | Freq: Once | INTRAVENOUS | Status: AC
  Administered 2024-07-22: 2 g via INTRAVENOUS

## 2024-07-22 MED ORDER — NIFEDIPINE 10 MG PO CAPS
10.0000 mg | ORAL_CAPSULE | Freq: Three times a day (TID) | ORAL | Status: DC
  Filled 2024-07-22: qty 1

## 2024-07-22 MED ORDER — LACTATED RINGERS IV BOLUS
500.0000 mL | Freq: Once | INTRAVENOUS | Status: AC
  Administered 2024-07-22: 500 mL via INTRAVENOUS

## 2024-07-22 MED ORDER — OXYTOCIN BOLUS FROM INFUSION: 333.0000 mL | Freq: Once | INTRAVENOUS | Status: DC

## 2024-07-22 MED ORDER — FENTANYL CITRATE (PF) 100 MCG/2ML IJ SOLN
50.0000 ug | INTRAMUSCULAR | Status: DC | PRN
  Administered 2024-07-22: 100 ug via INTRAVENOUS
  Filled 2024-07-22: qty 2

## 2024-07-22 MED ORDER — SOD CITRATE-CITRIC ACID 500-334 MG/5ML PO SOLN: 30.0000 mL | ORAL | Status: DC | PRN

## 2024-07-22 MED ORDER — BETAMETHASONE SOD PHOS & ACET 6 (3-3) MG/ML IJ SUSP
12.0000 mg | Freq: Once | INTRAMUSCULAR | Status: AC
  Administered 2024-07-22: 12 mg via INTRAMUSCULAR
  Filled 2024-07-22: qty 5

## 2024-07-22 MED ORDER — LACTATED RINGERS IV SOLN: 500.0000 mL | INTRAVENOUS | Status: DC | PRN

## 2024-07-22 MED ORDER — PENICILLIN G POT IN DEXTROSE 60000 UNIT/ML IV SOLN: 3.0000 10*6.[IU] | INTRAVENOUS | Status: DC

## 2024-07-22 MED ORDER — NIFEDIPINE 10 MG PO CAPS
10.0000 mg | ORAL_CAPSULE | Freq: Once | ORAL | Status: AC
  Administered 2024-07-22: 10 mg via ORAL

## 2024-07-22 MED ORDER — OXYCODONE-ACETAMINOPHEN 5-325 MG PO TABS: 2.0000 | ORAL_TABLET | ORAL | Status: DC | PRN

## 2024-07-22 MED ORDER — LIDOCAINE HCL (PF) 1 % IJ SOLN: 30.0000 mL | INTRAMUSCULAR | Status: DC | PRN

## 2024-07-22 MED ORDER — OXYCODONE-ACETAMINOPHEN 5-325 MG PO TABS: 1.0000 | ORAL_TABLET | ORAL | Status: DC | PRN

## 2024-07-22 MED ORDER — NIFEDIPINE 10 MG PO CAPS
ORAL_CAPSULE | ORAL | Status: AC
  Administered 2024-07-22: 10 mg via ORAL
  Filled 2024-07-22: qty 1

## 2024-07-22 MED ORDER — OXYTOCIN-SODIUM CHLORIDE 30-0.9 UT/500ML-% IV SOLN
2.5000 [IU]/h | INTRAVENOUS | Status: DC
  Filled 2024-07-22: qty 500

## 2024-07-22 NOTE — ED Notes (Signed)
 Fundal height 34-35in

## 2024-07-22 NOTE — ED Notes (Signed)
 Backspasm/ contraction

## 2024-07-22 NOTE — ED Notes (Addendum)
 Backspasm/ contraction

## 2024-07-22 NOTE — ED Triage Notes (Addendum)
 Pt is ambulatory to treatment room. She says that last night she felt like she could not empty her bladder. Today having lower back pressure and spasms. Nausea. Denies fevers. Irregular periods. Not having pain with urination currently. No vaginal bleeding, reports a clear discharge.

## 2024-07-22 NOTE — ED Notes (Incomplete)
 FHR 1

## 2024-07-22 NOTE — ED Notes (Signed)
 DR HUR Ob fellow at bedside

## 2024-07-22 NOTE — ED Notes (Signed)
 Patient comfortable with support person at bedside. Expressed comfort between contractions

## 2024-07-22 NOTE — ED Notes (Signed)
Culture swab sent to lab

## 2024-07-22 NOTE — Progress Notes (Signed)
 Received call from [facility] ED regarding pregnant patient of unknown gestation with complaint of back spasms.

## 2024-07-22 NOTE — H&P (Addendum)
 OBSTETRIC ADMISSION HISTORY AND PHYSICAL  Catherine Figueroa is 34 y.o. [phone] with IUP at ~34-35 weeks by FH presenting to [facility] ED for back pain, found to be pregnant and contracting. Reports the spasms started at about 1-2am and then continued through the day with greater intensity. She reported some loss of fluid about the same time.   She received no prenatal care.   She has irregular periods and had a baby last year. LMP unknown. She last had some VB at the end of December which she thought was a period.   Short interval pregnancy, has 57 mon old child.   ROS (+) FM, ctx, LOF (-) VB, HA, visual changes, CP, SOB, RUQ pain, peripheral edema.  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, Short cervical length during pregnancy  1B Preterm 2009 [redacted]w[redacted]d  454 g M Vag-Spont   FD   Patient Active Problem List   Diagnosis Date Noted   Indication for care in labor or delivery 07/22/2024   Preterm labor 07/22/2024   Short interval between pregnancies affecting pregnancy in third trimester, antepartum 07/22/2024   Left ankle injury, subsequent encounter 05/12/2017   Esophageal reflux 11/14/2007   Allergic rhinitis 10/20/2007   Medications Prior to Admission  Medication Sig Dispense Refill Last Dose/Taking   ondansetron  (ZOFRAN  ODT) 4 MG disintegrating tablet Take 1 tablet (4 mg total) by mouth every 8 (eight) hours as needed for nausea or vomiting. 20 tablet 0     Past Medical History: Past Medical History:  Diagnosis Date   GERD (gastroesophageal reflux disease)    Past Surgical  History: Past Surgical History:  Procedure Laterality Date   cord ablation  2008   cord ablation during pregnacy, cerclage   MOUTH SURGERY  2013   removed all teeth   Social History Social History   Socioeconomic History   Marital status: Married    Spouse name: Not on file   Number of children: Not on file   Years of education: Not on file   Highest education level: Not on file  Occupational History   Not on file  Tobacco Use   Smoking status: Every Day    Passive exposure: Never   Smokeless tobacco: Never  Vaping Use   Vaping status: Never Used  Substance and Sexual Activity   Alcohol use: Yes    Comment: occ   Drug use: No   Sexual activity: Yes    Birth control/protection: None  Other Topics Concern   Not on file  Social History Narrative   Not on file   Social Drivers of Health   Tobacco Use: High Risk (07/22/2024)   Patient History    Smoking Tobacco Use: Every Day    Smokeless Tobacco Use: Never    Passive Exposure: Never  Financial Resource Strain: Low Risk (05/18/2023)   Received from [facility]   Overall Financial Resource Strain (CARDIA)  Difficulty of Paying Living Expenses: Not hard at all  Food Insecurity: No Food Insecurity (07/22/2024)   Epic    Worried About Programme Researcher, Broadcasting/film/video in the Last Year: Never true    Ran Out of Food in the Last Year: Never true  Transportation Needs: No Transportation Needs (07/22/2024)   Epic    Lack of Transportation (Medical): No    Lack of Transportation (Non-Medical): No  Physical Activity: Inactive (05/18/2023)   Received from [facility]   Exercise Vital Sign    On average, how many days per week do you engage in moderate to strenuous exercise (like a brisk walk)?: 0 days    On average, how many minutes do you engage in exercise at this level?: 0 min  Stress: No Stress Concern Present (05/18/2023)   Received from [facility] - Occupational Stress  Questionnaire    Feeling of Stress : Only a little  Social Connections: Unknown (05/18/2023)   Received from [facility]   Social Connection and Isolation Panel    In a typical week, how many times do you talk on the phone with family, friends, or neighbors?: More than three times a week    How often do you get together with friends or relatives?: Three times a week    How often do you attend church or religious services?: Patient declined    Do you belong to any clubs or organizations such as church groups, unions, fraternal or athletic groups, or school groups?: Patient declined    How often do you attend meetings of the clubs or organizations you belong to?: Patient declined    Are you married, widowed, divorced, separated, never married, or living with a partner?: Married  Depression (PHQ2-9): Not on file  Alcohol Screen: Not on file  Housing: Low Risk (07/22/2024)   Epic    Unable to Pay for Housing in the Last Year: No    Number of Times Moved in the Last Year: 0    Homeless in the Last Year: No  Utilities: Not At Risk (07/22/2024)   Epic    Threatened with loss of utilities: No  Health Literacy: Low Risk (06/09/2023)   Received from [facility]    How often do you need to have someone help you when you read instructions, pamphlets, or other written material from your doctor or pharmacy?: Never   Family History: History reviewed. No pertinent family history.  Review of Systems  All systems reviewed and negative except as stated in HPI   PHYSICAL EXAM Blood pressure 128/78, pulse (!) 102, temperature 97.9 F (36.6 C), temperature source Oral, resp. rate 18, last menstrual period 05/28/2024, SpO2 100%. General appearance: alert, cooperative, and uncomfortable Lungs: respirations nonlabored Heart: regular rate Abdomen: gravid  Fetal monitoring: Baseline: 120 bpm, Variability: Good {> 6 bpm), Accelerations: unknown, and Decelerations: none  auscultated Uterine activityFrequency: Every 7-10 minutes by observation   SCE: 4/70/-3 Presentation: cephalic FERN - Negative at [facility]   Prenatal labs: ABO, Rh:   Antibody:   Rubella:   RPR:    HBsAg:    HIV: NON REACTIVE (01/24 2009)   No results found for: GBS  Anatomy US : none  There is no immunization history on file for this patient.  Results for orders placed or performed during the hospital encounter of 07/22/24 (from the past 24 hours)  Urinalysis, Routine w reflex microscopic -Urine, Clean  Catch   Collection Time: 07/22/24  7:40 PM  Result Value Ref Range   Color, Urine YELLOW YELLOW   APPearance HAZY (A) CLEAR   Specific Gravity, Urine <1.005 (L) 1.005 - 1.030   pH 7.0 5.0 - 8.0   Glucose, UA NEGATIVE NEGATIVE mg/dL   Hgb urine dipstick SMALL (A) NEGATIVE   Bilirubin Urine NEGATIVE NEGATIVE   Ketones, ur NEGATIVE NEGATIVE mg/dL   Protein, ur 30 (A) NEGATIVE mg/dL   Nitrite NEGATIVE NEGATIVE   Leukocytes,Ua MODERATE (A) NEGATIVE   RBC / HPF 0-5 0 - 5 RBC/hpf   WBC, UA 11-20 0 - 5 WBC/hpf   Bacteria, UA RARE (A) NONE SEEN   Squamous Epithelial / HPF 0-5 0 - 5 /HPF   Mucus PRESENT   Pregnancy, urine   Collection Time: 07/22/24  7:40 PM  Result Value Ref Range   Preg Test, Ur POSITIVE (A) NEGATIVE  Rapid HIV screen (HIV 1/2 Ab+Ag)   Collection Time: 07/22/24  8:09 PM  Result Value Ref Range   HIV-1 P24 Antigen - HIV24 NON REACTIVE NON REACTIVE   HIV 1/2 Antibodies NON REACTIVE NON REACTIVE   Interpretation (HIV Ag Ab)      A non reactive test result means that HIV 1 or HIV 2 antibodies and HIV 1 p24 antigen were not detected in the specimen.  hCG, quantitative, pregnancy   Collection Time: 07/22/24  8:10 PM  Result Value Ref Range   hCG, Beta Chain, Quant, S 18,738 (H) <5 mIU/mL  CBC with Differential   Collection Time: 07/22/24  8:10 PM  Result Value Ref Range   WBC 15.6 (H) 4.0 - 10.5 K/uL   RBC 3.95 3.87 - 5.11 MIL/uL   Hemoglobin 12.0 12.0 -  15.0 g/dL   HCT 64.7 (L) 63.9 - 53.9 %   MCV 89.1 80.0 - 100.0 fL   MCH 30.4 26.0 - 34.0 pg   MCHC 34.1 30.0 - 36.0 g/dL   RDW 85.6 88.4 - 84.4 %   Platelets 244 150 - 400 K/uL   nRBC 0.0 0.0 - 0.2 %   Neutrophils Relative % 77 %   Neutro Abs 11.9 (H) 1.7 - 7.7 K/uL   Lymphocytes Relative 16 %   Lymphs Abs 2.5 0.7 - 4.0 K/uL   Monocytes Relative 6 %   Monocytes Absolute 0.9 0.1 - 1.0 K/uL   Eosinophils Relative 0 %   Eosinophils Absolute 0.0 0.0 - 0.5 K/uL   Basophils Relative 0 %   Basophils Absolute 0.0 0.0 - 0.1 K/uL   Immature Granulocytes 1 %   Abs Immature Granulocytes 0.10 (H) 0.00 - 0.07 K/uL  Comprehensive metabolic panel   Collection Time: 07/22/24  8:10 PM  Result Value Ref Range   Sodium 135 135 - 145 mmol/L   Potassium 3.5 3.5 - 5.1 mmol/L   Chloride 101 98 - 111 mmol/L   CO2 20 (L) 22 - 32 mmol/L   Glucose, Bld 87 70 - 99 mg/dL   BUN 7 6 - 20 mg/dL   Creatinine, Ser 9.43 0.44 - 1.00 mg/dL   Calcium 9.8 8.9 - 89.6 mg/dL   Total Protein 7.5 6.5 - 8.1 g/dL   Albumin 4.0 3.5 - 5.0 g/dL   AST 17 15 - 41 U/L   ALT 11 0 - 44 U/L   Alkaline Phosphatase 126 38 - 126 U/L   Total Bilirubin 0.4 0.0 - 1.2 mg/dL   GFR, Estimated >39 >39 mL/min   Anion gap  14 5 - 15  Glucose, capillary   Collection Time: 07/22/24 10:20 PM  Result Value Ref Range   Glucose-Capillary 79 70 - 99 mg/dL    Patient Active Problem List   Diagnosis Date Noted   Indication for care in labor or delivery 07/22/2024   Preterm labor 07/22/2024   Short interval between pregnancies affecting pregnancy in third trimester, antepartum 07/22/2024   Left ankle injury, subsequent encounter 05/12/2017   Esophageal reflux 11/14/2007   Allergic rhinitis 10/20/2007    Prenatal Transfer Tool  Maternal Diabetes: Unknown, no h/o gDM Genetic Screening: None Maternal Ultrasounds/Referrals: None Fetal Ultrasounds or other Referrals:  None Maternal Substance Abuse:  tobacco use Significant Maternal  Medications:  None Significant Maternal Lab Results: GBS unknown Number of Prenatal Visits:Less than or equal to 3 verified prenatal visits. No prenatal care. Maternal Vaccinations: None Other Comments:  None   ASSESSMENT & PLAN Aubreyana Saltz is 34 y.o. [phone] with IUP at Unknown Not found. admitted for SOL.  #Labor: SOL, possibly PTL. Nifedipine  for tocolysis given x 2 in the [facility] ER.  Transfer to [facility] L&D. #Pain: Per patient preference, encourage ambulation  #Elevated bp- no hx of PEC. Reports mild HA in [facility] ER - baseline labs collected - no h/o gHTN dz - improving with IR nifed tocolysis  #GBS status:  [place], PCR collected. Ancef  2g IV received at [facility] ER #Feeding: TBD #Reproductive Life planning: TBD #Circ: TBD   Barabara Maier DO FM-OB Fellow Center for [facility]  Attestation of Attending Supervision of OB Fellow: Evaluation and management procedures were performed by the Family Medicine OB Fellow under my supervision.  I have reviewed the Fellow's note and chart, and I agree with the management and plan.  Suzen Maryan Masters, MD, MPH, ABFM Attending Physician Center for [facility] , [facility]

## 2024-07-22 NOTE — ED Notes (Signed)
 DR newton at bedside

## 2024-07-22 NOTE — ED Notes (Signed)
 Ems at bedside. Report call to L& D

## 2024-07-22 NOTE — ED Notes (Signed)
 Mom on bedpan urge to urinate

## 2024-07-22 NOTE — ED Notes (Signed)
 Fetal doppler 157, OB RN on phone.

## 2024-07-22 NOTE — ED Notes (Signed)
 Patient ambulatory to restroom  ?

## 2024-07-22 NOTE — Progress Notes (Signed)
 EFM removed for patient transfer to L&D via EMS

## 2024-07-22 NOTE — ED Notes (Signed)
 Bedside ultrasound was complete in treatment room 16, pt brought to [facility], placed on fetal monitors. Rapid OB notified of placement on monitor.

## 2024-07-23 ENCOUNTER — Inpatient Hospital Stay (HOSPITAL_COMMUNITY): Admitting: Anesthesiology

## 2024-07-23 ENCOUNTER — Encounter (HOSPITAL_COMMUNITY): Payer: Self-pay | Admitting: Family Medicine

## 2024-07-23 LAB — PROTEIN / CREATININE RATIO, URINE
Creatinine, Urine: 26 mg/dL
Creatinine, Urine: 68 mg/dL
Protein Creatinine Ratio: 1.4 mg/mg — ABNORMAL HIGH
Protein Creatinine Ratio: 1.2 mg/mg — ABNORMAL HIGH
Total Protein, Urine: 36 mg/dL
Total Protein, Urine: 84 mg/dL

## 2024-07-23 LAB — TYPE AND SCREEN
ABO/RH(D): A POS
Antibody Screen: NEGATIVE

## 2024-07-23 LAB — ABO/RH: ABO/RH(D): A POS

## 2024-07-23 LAB — GROUP B STREP BY PCR: Group B strep by PCR: NEGATIVE

## 2024-07-23 LAB — HEPATITIS B SURFACE ANTIGEN: Hepatitis B Surface Ag: NONREACTIVE

## 2024-07-23 LAB — SYPHILIS: RPR W/REFLEX TO RPR TITER AND TREPONEMAL ANTIBODIES, TRADITIONAL SCREENING AND DIAGNOSIS ALGORITHM: RPR Ser Ql: NONREACTIVE

## 2024-07-23 MED ORDER — ONDANSETRON HCL 4 MG PO TABS: 4.0000 mg | ORAL_TABLET | ORAL | Status: DC | PRN

## 2024-07-23 MED ORDER — FENTANYL-BUPIVACAINE-NACL 0.5-0.125-0.9 MG/250ML-% EP SOLN
EPIDURAL | Status: DC | PRN
  Administered 2024-07-23: 12 mL/h via EPIDURAL

## 2024-07-23 MED ORDER — IBUPROFEN 600 MG PO TABS
600.0000 mg | ORAL_TABLET | Freq: Four times a day (QID) | ORAL | Status: DC
  Administered 2024-07-23 – 2024-07-25 (×10): 600 mg via ORAL
  Filled 2024-07-23 (×10): qty 1

## 2024-07-23 MED ORDER — PRENATAL MULTIVITAMIN CH
1.0000 | ORAL_TABLET | Freq: Every day | ORAL | Status: DC
  Administered 2024-07-23 – 2024-07-25 (×3): 1 via ORAL
  Filled 2024-07-23 (×3): qty 1

## 2024-07-23 MED ORDER — METHYLERGONOVINE MALEATE 0.2 MG/ML IJ SOLN
INTRAMUSCULAR | Status: AC
  Administered 2024-07-23: 0.2 mg via INTRAVENOUS
  Filled 2024-07-23: qty 1

## 2024-07-23 MED ORDER — SIMETHICONE 80 MG PO CHEW: 80.0000 mg | CHEWABLE_TABLET | ORAL | Status: DC | PRN

## 2024-07-23 MED ORDER — BENZOCAINE-MENTHOL 20-0.5 % EX AERO: 1.0000 | INHALATION_SPRAY | CUTANEOUS | Status: DC | PRN

## 2024-07-23 MED ORDER — DIBUCAINE (PERIANAL) 1 % EX OINT: 1.0000 | TOPICAL_OINTMENT | CUTANEOUS | Status: DC | PRN

## 2024-07-23 MED ORDER — COCONUT OIL OIL
1.0000 | TOPICAL_OIL | Status: DC | PRN
  Administered 2024-07-25: 1 via TOPICAL

## 2024-07-23 MED ORDER — SODIUM CHLORIDE 0.9 % IV SOLN: 5.0000 10*6.[IU] | Freq: Once | INTRAVENOUS | Status: DC

## 2024-07-23 MED ORDER — PENICILLIN G POT IN DEXTROSE 60000 UNIT/ML IV SOLN: 3.0000 10*6.[IU] | INTRAVENOUS | Status: DC

## 2024-07-23 MED ORDER — DIPHENHYDRAMINE HCL 25 MG PO CAPS: 25.0000 mg | ORAL_CAPSULE | Freq: Four times a day (QID) | ORAL | Status: DC | PRN

## 2024-07-23 MED ORDER — TETANUS-DIPHTH-ACELL PERTUSSIS 5-2-15.5 LF-MCG/0.5 IM SUSP: 0.5000 mL | Freq: Once | INTRAMUSCULAR | Status: DC

## 2024-07-23 MED ORDER — ZOLPIDEM TARTRATE 5 MG PO TABS: 5.0000 mg | ORAL_TABLET | Freq: Every evening | ORAL | Status: DC | PRN

## 2024-07-23 MED ORDER — SENNOSIDES-DOCUSATE SODIUM 8.6-50 MG PO TABS
2.0000 | ORAL_TABLET | Freq: Every day | ORAL | Status: DC
  Administered 2024-07-24 – 2024-07-25 (×2): 2 via ORAL
  Filled 2024-07-23 (×2): qty 2

## 2024-07-23 MED ORDER — ACETAMINOPHEN 325 MG PO TABS
650.0000 mg | ORAL_TABLET | ORAL | Status: DC | PRN
  Administered 2024-07-23: 650 mg via ORAL
  Filled 2024-07-23: qty 2

## 2024-07-23 MED ORDER — FENTANYL-BUPIVACAINE-NACL 0.5-0.125-0.9 MG/250ML-% EP SOLN
EPIDURAL | Status: AC
  Filled 2024-07-23: qty 250

## 2024-07-23 MED ORDER — LIDOCAINE-EPINEPHRINE (PF) 1.5 %-1:200000 IJ SOLN
INTRAMUSCULAR | Status: DC | PRN
  Administered 2024-07-23: 5 mL via EPIDURAL

## 2024-07-23 MED ORDER — WITCH HAZEL-GLYCERIN EX PADS: 1.0000 | MEDICATED_PAD | CUTANEOUS | Status: DC | PRN

## 2024-07-23 MED ORDER — ONDANSETRON HCL 4 MG/2ML IJ SOLN: 4.0000 mg | INTRAMUSCULAR | Status: DC | PRN

## 2024-07-23 NOTE — ED Provider Notes (Signed)
 " [facility] Provider Note   CSN: [id number] Arrival date & time: 07/22/24  8078     Patient presents with: Back Pain   Catherine Figueroa is a 34 y.o. female.   HPI     33yo female [phone] presents with concern for back spasms. Reports history of irregular menses, last bleeding in December.  She began having back spasms last night.  Intermittently, feels like contractions, severe pain.  Nausea. Had not noticed any leakage of fluid until arrival to ED. No fevers. Has not noted fetal movement//not aware of pregnancy prior to results in ED.    Past Medical History:  Diagnosis Date   GERD (gastroesophageal reflux disease)      Prior to Admission medications  Medication Sig Start Date End Date Taking? Authorizing Provider  ondansetron  (ZOFRAN  ODT) 4 MG disintegrating tablet Take 1 tablet (4 mg total) by mouth every 8 (eight) hours as needed for nausea or vomiting. 04/30/21   Roselyn Carlin NOVAK, MD    Allergies: Patient has no known allergies.    Review of Systems  Updated Vital Signs BP 112/64 (BP Location: Left Arm)   Pulse 64   Temp 98.2 F (36.8 C) (Oral)   Resp 17   LMP 05/28/2024   SpO2 96%   Breastfeeding Unknown   Physical Exam Vitals and nursing note reviewed.  Constitutional:      General: She is in acute distress (pain).     Appearance: She is well-developed. She is not diaphoretic.  HENT:     Head: Normocephalic and atraumatic.  Eyes:     Conjunctiva/sclera: Conjunctivae normal.  Cardiovascular:     Rate and Rhythm: Normal rate and regular rhythm.     Pulses: Normal pulses.     Heart sounds: Normal heart sounds.  Pulmonary:     Effort: Pulmonary effort is normal. No respiratory distress.  Abdominal:     General: There is no distension (gravid).     Palpations: Abdomen is soft.     Tenderness: There is abdominal tenderness. There is no guarding.  Musculoskeletal:        General: No tenderness.     Cervical back: Normal range of motion.   Skin:    General: Skin is warm and dry.     Findings: No erythema or rash.  Neurological:     Mental Status: She is alert and oriented to person, place, and time.     (all labs ordered are listed, but only abnormal results are displayed) Labs Reviewed  URINALYSIS, ROUTINE W REFLEX MICROSCOPIC - Abnormal; Notable for the following components:      Result Value   APPearance HAZY (*)    Specific Gravity, Urine <1.005 (*)    Hgb urine dipstick SMALL (*)    Protein, ur 30 (*)    Leukocytes,Ua MODERATE (*)    Bacteria, UA RARE (*)    All other components within normal limits  PREGNANCY, URINE - Abnormal; Notable for the following components:   Preg Test, Ur POSITIVE (*)    All other components within normal limits  HCG, QUANTITATIVE, PREGNANCY - Abnormal; Notable for the following components:   hCG, Beta Chain, Quant, S [id number] (*)    All other components within normal limits  CBC WITH DIFFERENTIAL/PLATELET - Abnormal; Notable for the following components:   WBC 15.6 (*)    HCT 35.2 (*)    Neutro Abs 11.9 (*)    Abs Immature Granulocytes 0.10 (*)    All  other components within normal limits  COMPREHENSIVE METABOLIC PANEL WITH GFR - Abnormal; Notable for the following components:   CO2 20 (*)    All other components within normal limits  PROTEIN / CREATININE RATIO, URINE - Abnormal; Notable for the following components:   Protein Creatinine Ratio 1.4 (*)    All other components within normal limits  PROTEIN / CREATININE RATIO, URINE - Abnormal; Notable for the following components:   Protein Creatinine Ratio 1.2 (*)    All other components within normal limits  GROUP B STREP BY PCR  RAPID HIV SCREEN (HIV 1/2 AB+AG)  GLUCOSE, CAPILLARY  HEPATITIS B SURFACE ANTIGEN  SYPHILIS: RPR W/REFLEX TO RPR TITER AND TREPONEMAL ANTIBODIES, TRADITIONAL SCREENING AND DIAGNOSIS ALGORITHM  RUBELLA SCREEN  MISC [facility] TEST (SEND OUT)  MISC [facility] TEST (SEND OUT)  CBC  ABO/RH  TYPE AND  SCREEN  GC/CHLAMYDIA PROBE AMP () NOT AT [facility]    EKG: None  Radiology: US  MFM OB COMP + 14 WK Result Date: 07/23/2024 ----------------------------------------------------------------------  OBSTETRICS REPORT                        (Signed Final 07/23/2024 01:10 pm) ---------------------------------------------------------------------- Patient Info  ID #:       [id number]                          D.O.B.:  [birth year] (33 yrs)(F)  Name:       Catherine Figueroa                   Visit Date: 07/22/2024 10:47 pm ---------------------------------------------------------------------- Performed By  Attending:        Delora Smaller DO       Referred By:       [facility]                                                              Suites  Performed By:     Nat GORMAN Plant     [facility] ---------------------------------------------------------------------- Orders  #  Description                           Code        Ordered By  1  US  MFM OB COMP + 14 WK                76805.01    KIMBERLY NEWTON ----------------------------------------------------------------------  #  Order #                     Accession #                Episode #  1  [id number]                   [id number]  [id number] ---------------------------------------------------------------------- Indications  Preterm contractions                            O47.00  Insufficient Prenatal Care (None)               O09.30  [redacted] weeks gestation of pregnancy                 Z3A.35  Encounter for uncertain dates                   Z36.87  Premature rupture of membranes - leaking        O42.90  fluid ---------------------------------------------------------------------- Fetal Evaluation  Num Of Fetuses:          1  Preg. Location:          0  Fetal Heart Rate(bpm):   132  Cardiac Activity:        Observed  Presentation:            Cephalic  Placenta:                 Anterior  P. Cord Insertion:       Not well visualized  Amniotic Fluid  AFI FV:      Oligohydramnios  AFI Sum(cm)     %Tile       Largest Pocket(cm)  2.7             < 3         1.4  RUQ(cm)       RLQ(cm)       LUQ(cm)        LLQ(cm)  0             1.3           0              1.4 ---------------------------------------------------------------------- Biometry  BPD:      87.1  mm     G. Age:  35w 1d         44  %    CI:        75.95   %    70 - 86                                                          FL/HC:       21.9  %    20.1 - 22.1  HC:      316.8  mm     G. Age:  35w 4d         19  %    HC/AC:       0.99       0.93 - 1.11  AC:      319.6  mm     G. Age:  35w 6d         68  %    FL/BPD:      79.7  %    71 - 87  FL:       69.4  mm     G. Age:  35w 4d         45  %    FL/AC:       21.7  %  20 - 24  HUM:      60.2  mm     G. Age:  35w 0d         51  %  Est. FW:    2746   gm     6 lb 1 oz     53  %  Est. FW at 39 Wks:       3467   gm   7 lb 10 oz ---------------------------------------------------------------------- Gestational Age  U/S Today:     35w 4d                                        EDD:   08/22/24  Best:          35w 4d     Det. By:  U/S (07/22/24)           EDD:   08/22/24 ---------------------------------------------------------------------- Anatomy  Cranium:               Not well visualized    Aortic Arch:            Not well visualized  Cavum:                 Not well visualized    Ductal Arch:            Not well visualized  Ventricles:            Not well visualized    Diaphragm:              Not well visualized  Choroid Plexus:        Not well visualized    Stomach:                Appears normal, left                                                                        sided  Cerebellum:            Not well visualized    Abdomen:                Appears normal  Posterior Fossa:       Not well visualized    Abdominal Wall:         Not well visualized  Nuchal Fold:           Not  well visualized    Cord Vessels:           Appears normal (3                                                                        vessel cord)  Face:                  Orbits nl; profile not Kidneys:  Appear normal                         well visualized  Lips:                  Not well visualized    Bladder:                Appears normal  Palate:                Not well visualized    Spine:                  Not well visualized  Heart:                 Appears normal; EIF    Upper Extremities:      Present  RVOT:                  Not well visualized    Lower Extremities:      Present  LVOT:                  Appears normal  Other:  Technically difficult due to advanced gestational age and low AFI. ---------------------------------------------------------------------- Comments  Hospital Ultrasound  Clinical context: The patient is at the MAU for acute onset of  labor. An ultrasound was ordered to assess fetal wellbeing.  Sonographic findings  Single intrauterine pregnancy at 35w 4d  Fetal cardiac activity: Observed and appears normal but is  extremely limited due to advanced gestation.  Presentation: Cephalic.  Amniotic fluid volume: Oligohydramnios. AFI: 2.7 cm, MVP:  1.4 cm.  Placenta: Anterior.  Recommendations  - The patient is now s/p delivery  - Continue f/u with OB provider  This was a limited ultrasound with a remote read. If an official  MFM consult is requested for any reason please call/place an  order in Epic. ----------------------------------------------------------------------                  Delora Smaller, DO Electronically Signed Final Report   07/23/2024 01:10 pm ----------------------------------------------------------------------     .Critical Care  Performed by: Dreama Longs, MD Authorized by: Dreama Longs, MD   Critical care provider statement:    Critical care time (minutes):  30   Critical care was time spent personally by me on the following activities:   Development of treatment plan with patient or surrogate, discussions with consultants, evaluation of patient's response to treatment, examination of patient, ordering and review of laboratory studies, ordering and review of radiographic studies, ordering and performing treatments and interventions, pulse oximetry, re-evaluation of patient's condition and review of old charts    Medications Ordered in the ED  ibuprofen  (ADVIL ) tablet 600 mg (600 mg Oral Given 07/23/24 1748)  acetaminophen  (TYLENOL ) tablet 650 mg (has no administration in time range)  zolpidem  (AMBIEN ) tablet 5 mg (has no administration in time range)  diphenhydrAMINE  (BENADRYL ) capsule 25 mg (has no administration in time range)  senna-docusate (Senokot-S) tablet 2 tablet (has no administration in time range)  simethicone  (MYLICON) chewable tablet 80 mg (has no administration in time range)  ondansetron  (ZOFRAN ) tablet 4 mg (has no administration in time range)    Or  ondansetron  (ZOFRAN ) injection 4 mg (has no administration in time range)  prenatal multivitamin tablet 1 tablet (1 tablet Oral Given 07/23/24 1304)  witch hazel-glycerin  (TUCKS) pad 1 Application (has no administration in time range)    And  dibucaine (  NUPERCAINAL) 1 % rectal ointment 1 Application (has no administration in time range)  benzocaine -Menthol  (DERMOPLAST) 20-0.5 % topical spray 1 Application (has no administration in time range)  coconut oil (has no administration in time range)  Tdap (ADACEL) injection 0.5 mL (has no administration in time range)  lactated ringers  bolus 500 mL (500 mLs Intravenous New Bag/Given 07/22/24 2053)  ceFAZolin  (ANCEF ) IVPB 2g/100 mL premix (2 g Intravenous New Bag/Given 07/22/24 2120)  NIFEdipine  (PROCARDIA ) capsule 10 mg (10 mg Oral Given 07/22/24 2128)  betamethasone  acetate-betamethasone  sodium phosphate (CELESTONE ) injection 12 mg (12 mg Intramuscular Given 07/22/24 2246)  fentaNYL  2 mcg/mL w/bupivacaine  0.125% in NS 250 mL  0.5-0.125-0.9 MG/250ML-% (  Override pull for Anesthesia 07/23/24 0223)  methylergonovine (METHERGINE ) 0.2 MG/ML injection (0.2 mg Intravenous Given 07/23/24 9666)                                    Medical Decision Making Amount and/or Complexity of Data Reviewed Labs: ordered.   33yo female [phone] presents with concern for back spasms.  Found to be pregnant, with back spasms consistent with contractions.  Unknown dates given irregular menses, however of viable gestational age by size on US  and fundal height. Hooked up to [facility], toco. Experienced leakage of fluid after arrival.  Contacted Dr. Jayne, initial plan for transfer, however on recheck head felt on exam and concern for full dilation, increasing contractions.  Notified OB, contacted NICU.  Gratefully, OB team including Dr. Eldonna came to bedside for evaluation with concern for labor.  Feel cervical exam consistent with dilation 4-5cm and stable for transfer to MAU.  Noted elevated BP in ED, given abx, transferred to MAU for further care and continuation of labor and delivery.     Final diagnoses:  Uterine contractions    ED Discharge Orders     None          Dreama Longs, MD 07/23/24 2203  "

## 2024-07-23 NOTE — Lactation Note (Signed)
 This note was copied from a baby's chart. Lactation Consultation Note  Patient Name: Catherine Figueroa Unijb'd Date: 07/23/2024 Age:36 hours Reason for consult: Initial assessment;Late-preterm 34-36.6wks.  P4, LPTI. Per MOB, infant feeding varies from 3 minutes to 10 or 15 minutes. LC discussed LPTI feeding behaviors and supplementation, MOB declined using donor breast milk and ask for Enfamil 20 kcal formula. LC did not observe latch due infant recently breastfeeding at 1700 pm and currently asleep. MOB will latch infant at the next feeding and then supplement infant with 20 kcal formula and based on infant's weight will offer 14 mls of EBM/formula per feeding. LC set MOB up with DEBP using 21 mm breast flange and MOB was pumping as LC left the room. LC discussed the importance of maternal rest, meals and hydration. MOB was  made aware of O/P services, breastfeeding support groups, community resources, and our phone # for post-discharge questions.    MOB knows that her EBM is safe for 4 hours at room temperature whereas formula RTF once open only safe for 1 hour. LC sent referral for STORK DEBP today.   Day 1 Feeding plans: 1- MOB will feed infant every 3 hours and limit total feedings breast ( chest) and formula to 30 minutes or less. MOB wil supplement infant each feeding Day 1 (14 mls) or more per feeding of EBM/20 kcal formula. 2- MOB will continue to use the DEBP every 3 hours for 15 minutes. 3- MOB knows to call for latch assistance if needed.  Maternal Data Does the patient have breastfeeding experience prior to this delivery?: Yes How long did the patient breastfeed?: Per MOB, she had latch difficulties and low milk supply, only breastfeed her third child for 2 weeks.  Feeding Mother's Current Feeding Choice: Breast Milk and Formula  LATCH Score  LC did not observe latch due infant recently breastfeeding at 1700 pm and currently asleep.                  Lactation Tools  Discussed/Used    Interventions Interventions: Breast feeding basics reviewed;Skin to skin;Pace feeding;Education;Guidelines for Milk Supply and Pumping Schedule Handout;LC Services brochure;CDC milk storage guidelines;CDC Guidelines for Breast Pump Cleaning;LPT handout/interventions  Discharge Pump: Referral sent for [facility] Pump  Consult Status Consult Status: Follow-up Date: 07/24/24 Follow-up type: In-patient    Catherine Figueroa 07/23/2024, 5:56 PM

## 2024-07-23 NOTE — Anesthesia Procedure Notes (Signed)
 Epidural Patient location during procedure: OB Start time: 07/23/2024 2:03 AM End time: 07/23/2024 2:10 AM  Staffing Anesthesiologist: Dorethea Cordella SQUIBB, DO Performed: anesthesiologist   Preanesthetic Checklist Completed: patient identified, IV checked, site marked, risks and benefits discussed, surgical consent, monitors and equipment checked, pre-op evaluation and timeout performed  Epidural Patient position: sitting Prep: ChloraPrep Patient monitoring: heart rate, continuous pulse ox and blood pressure Approach: midline Location: L4-L5 Injection technique: LOR saline  Needle:  Needle type: Tuohy  Needle gauge: 17 G Needle length: 9 cm Needle insertion depth: 8 cm Catheter type: closed end flexible Catheter size: 20 Guage Catheter at skin depth: 14 cm Test dose: negative and 1.5% lidocaine  with Epi 1:200 K  Assessment Events: blood not aspirated, no cerebrospinal fluid, injection not painful, no injection resistance and no paresthesia  Additional Notes  Patient identified. Risks/Benefits/Options discussed with patient including but not limited to bleeding, infection, nerve damage, paralysis, failed block, incomplete pain control, headache, blood pressure changes, nausea, vomiting, reactions to medications, itching and postpartum back pain. Confirmed with bedside nurse the patient's most recent platelet count. Confirmed with patient that they are not currently taking any anticoagulation, have any bleeding history or any family history of bleeding disorders. Patient expressed understanding and wished to proceed. All questions were answered. Sterile technique was used throughout the entire procedure. Please see nursing notes for vital signs. Test dose was given through epidural catheter and negative prior to continuing to dose epidural or start infusion. Warning signs of high block given to the patient including shortness of breath, tingling/numbness in hands, complete motor block,  or any concerning symptoms with instructions to call for help. Patient was given instructions on fall risk and not to get out of bed. All questions and concerns addressed with instructions to call with any issues or inadequate analgesia.    Reason for block:procedure for pain

## 2024-07-23 NOTE — Anesthesia Preprocedure Evaluation (Signed)
 "                                  Anesthesia Evaluation  Patient identified by MRN, date of birth, ID band Patient awake    Reviewed: Allergy & Precautions, NPO status , Patient's Chart, lab work & pertinent test results  Airway Mallampati: II  TM Distance: >3 FB Neck ROM: Full    Dental no notable dental hx.    Pulmonary Current Smoker   Pulmonary exam normal        Cardiovascular hypertension,  Rhythm:Regular Rate:Normal     Neuro/Psych negative neurological ROS  negative psych ROS   GI/Hepatic Neg liver ROS,GERD  ,,  Endo/Other  negative endocrine ROS    Renal/GU negative Renal ROS  negative genitourinary   Musculoskeletal negative musculoskeletal ROS (+)    Abdominal Normal abdominal exam  (+)   Peds  Hematology Lab Results      Component                Value               Date                      WBC                      15.6 (H)            07/22/2024                HGB                      12.0                07/22/2024                HCT                      35.2 (L)            07/22/2024                MCV                      89.1                07/22/2024                PLT                      244                 07/22/2024              Anesthesia Other Findings   Reproductive/Obstetrics (+) Pregnancy                              Anesthesia Physical Anesthesia Plan  ASA: 2  Anesthesia Plan: Epidural   Post-op Pain Management:    Induction:   PONV Risk Score and Plan: 1 and Treatment may vary due to age or medical condition  Airway Management Planned: Natural Airway  Additional Equipment: None  Intra-op Plan:   Post-operative Plan:   Informed Consent: I have reviewed the patients History and Physical, chart, labs and discussed the procedure including  the risks, benefits and alternatives for the proposed anesthesia with the patient or authorized representative who has indicated his/her  understanding and acceptance.     Dental advisory given  Plan Discussed with:   Anesthesia Plan Comments:         Anesthesia Quick Evaluation  "

## 2024-07-23 NOTE — Discharge Summary (Signed)
 Postpartum Discharge Summary    Patient Name: Catherine Figueroa DOB: Dec 13, 1990 MRN: [id number]  Date of admission: 07/22/2024 Delivery date:07/23/2024 Delivering provider: JOMARIE CAMPI A Date of discharge: 07/25/2024  Admitting diagnosis: Preterm labor [O60.00] Intrauterine pregnancy: [redacted]w[redacted]d     Secondary diagnosis:  Principal Problem:   Preterm labor Active Problems:   Indication for care in labor or delivery   Short interval between pregnancies affecting pregnancy in third trimester, antepartum   Elevated blood pressure affecting pregnancy in third trimester, antepartum   Proteinuria  Additional problems: None     Discharge diagnosis: Preterm Pregnancy Delivered                                              Post partum procedures:None  Augmentation: N/A Complications: None  Hospital course: Onset of Labor With Vaginal Delivery      34 y.o. yo [id number] at [redacted]w[redacted]d was admitted in Latent Labor on 07/22/2024. Labor course was complicated by pre-term labor  Membrane Rupture Time/Date:  ,   Delivery Method:Vaginal, Spontaneous Operative Delivery:N/A Episiotomy: None Lacerations:  None Patient had an uncomplicated  postpartum course.  She is ambulating, tolerating a regular diet, passing flatus, and urinating well. Hgb: 9.6 (07/24/2024). Patient was started on oral iron therapy for clinically significant but asymptomatic acute postoperative anemia due to expected blood loss. Patient is discharged home in stable condition on 07/25/24.  Newborn Data: Birth date:07/23/2024 Birth time:3:22 AM Gender:Female Living status:Living Apgars:9 ,9  Weight:2863 g  Magnesium Sulfate received: No BMZ received: Yes, 1 dose Rhophylac:N/A MMR: N/A T-DaP: N/A Flu: No RSV Vaccine received: No Transfusion:No  Immunizations received: There is no immunization history for the selected administration types on file for this patient.  Physical exam  Vitals:   07/24/24 0558 07/24/24 1129 07/24/24 2020  07/25/24 0505  BP: 107/66 113/60 101/60 124/68  Pulse: 61 62 66 70  Resp: 20 18 18 18   Temp: 97.7 F (36.5 C) 97.8 F (36.6 C) 98.6 F (37 C) 98.2 F (36.8 C)  TempSrc: Oral Oral Oral Oral  SpO2: 100% 98% 100% 100%   General: alert, cooperative, and no distress Lochia: appropriate Uterine Fundus: firm DVT Evaluation: No evidence of DVT seen on physical exam. Labs: Lab Results  Component Value Date   WBC 17.7 (H) 07/24/2024   HGB 9.6 (L) 07/24/2024   HCT 27.6 (L) 07/24/2024   MCV 88.5 07/24/2024   PLT 235 07/24/2024      Latest Ref Rng & Units 07/22/2024    8:10 PM  CMP  Glucose 70 - 99 mg/dL 87   BUN 6 - 20 mg/dL 7   Creatinine 9.55 - 8.99 mg/dL 9.43   Sodium 864 - 854 mmol/L 135   Potassium 3.5 - 5.1 mmol/L 3.5   Chloride 98 - 111 mmol/L 101   CO2 22 - 32 mmol/L 20   Calcium 8.9 - 10.3 mg/dL 9.8   Total Protein 6.5 - 8.1 g/dL 7.5   Total Bilirubin 0.0 - 1.2 mg/dL 0.4   Alkaline Phos 38 - 126 U/L 126   AST 15 - 41 U/L 17   ALT 0 - 44 U/L 11    Edinburgh Score:    07/24/2024   10:42 AM  Edinburgh Postnatal Depression Scale Screening Tool  I have been able to laugh and see the funny side of things. 0  I have looked  forward with enjoyment to things. 0  I have blamed myself unnecessarily when things went wrong. 2  I have been anxious or worried for no good reason. 3  I have felt scared or panicky for no good reason. 1  Things have been getting on top of me. 0  I have been so unhappy that I have had difficulty sleeping. 0  I have felt sad or miserable. 1  I have been so unhappy that I have been crying. 0  The thought of harming myself has occurred to me. 0  Edinburgh Postnatal Depression Scale Total 7   Edinburgh Postnatal Depression Scale Total: 7   After visit meds:  Allergies as of 07/25/2024   No Known Allergies      Medication List     STOP taking these medications    ondansetron  4 MG disintegrating tablet Commonly known as: Zofran  ODT        TAKE these medications    acetaminophen  325 MG tablet Commonly known as: Tylenol  Take 2 tablets (650 mg total) by mouth every 4 (four) hours as needed (for pain scale < 4).   ferrous sulfate  325 (65 FE) MG tablet Take 1 tablet (325 mg total) by mouth every other day. Start taking on: July 26, 2024   ibuprofen  600 MG tablet Commonly known as: ADVIL  Take 1 tablet (600 mg total) by mouth every 6 (six) hours as needed.         Discharge home in stable condition Infant Feeding: Bottle and Breast Infant Disposition:home with mother Discharge instruction: per After Visit Summary and Postpartum booklet. Activity: Advance as tolerated. Pelvic rest for 6 weeks.  Diet: routine diet Future Appointments: Future Appointments  Date Time Provider Department Center  09/04/2024  9:35 AM Izell Harari, MD [facility]   Follow up Visit:  Message sent 1/25 to [facility]  Please schedule this patient for a In person postpartum visit in 6 weeks with the following provider: Any provider. Additional Postpartum F/U:N/A  High risk pregnancy complicated by: no prenatal care Delivery mode:  Vaginal, Spontaneous Anticipated Birth Control:  Plans to speak to [facility] provider about interval BTL    07/25/2024 Jazmine Liboon, CNM

## 2024-07-24 DIAGNOSIS — R809 Proteinuria, unspecified: Secondary | ICD-10-CM | POA: Insufficient documentation

## 2024-07-24 LAB — CBC
HCT: 27.6 % — ABNORMAL LOW (ref 36.0–46.0)
Hemoglobin: 9.6 g/dL — ABNORMAL LOW (ref 12.0–15.0)
MCH: 30.8 pg (ref 26.0–34.0)
MCHC: 34.8 g/dL (ref 30.0–36.0)
MCV: 88.5 fL (ref 80.0–100.0)
Platelets: 235 10*3/uL (ref 150–400)
RBC: 3.12 MIL/uL — ABNORMAL LOW (ref 3.87–5.11)
RDW: 14.2 % (ref 11.5–15.5)
WBC: 17.7 10*3/uL — ABNORMAL HIGH (ref 4.0–10.5)
nRBC: 0 % (ref 0.0–0.2)

## 2024-07-24 LAB — GC/CHLAMYDIA PROBE AMP (~~LOC~~) NOT AT ARMC
Chlamydia: NEGATIVE
Comment: NEGATIVE
Comment: NORMAL
Neisseria Gonorrhea: NEGATIVE

## 2024-07-24 MED ORDER — FERROUS SULFATE 325 (65 FE) MG PO TABS
325.0000 mg | ORAL_TABLET | ORAL | Status: DC
  Administered 2024-07-24: 325 mg via ORAL
  Filled 2024-07-24: qty 1

## 2024-07-24 NOTE — Anesthesia Postprocedure Evaluation (Signed)
"   Anesthesia Post Note  Patient: Catherine Figueroa  Procedure(s) Performed: AN AD HOC LABOR EPIDURAL     Patient location during evaluation: Mother Baby Anesthesia Type: Epidural Level of consciousness: awake and alert Pain management: pain level controlled Vital Signs Assessment: post-procedure vital signs reviewed and stable Respiratory status: spontaneous breathing, nonlabored ventilation and respiratory function stable Cardiovascular status: stable Postop Assessment: no headache, no backache and epidural receding Anesthetic complications: no   No notable events documented.  Last Vitals:  Vitals:   07/23/24 2218 07/24/24 0558  BP: (!) 111/58 107/66  Pulse: 68 61  Resp: 20 20  Temp: 36.9 C 36.5 C  SpO2: 99% 100%    Last Pain:  Vitals:   07/24/24 0811  TempSrc:   PainSc: 0-No pain   Pain Goal:                   Catherine Figueroa      "

## 2024-07-24 NOTE — Clinical Social Work Maternal (Signed)
 " CLINICAL SOCIAL WORK MATERNAL/CHILD NOTE  Patient Details  Name: Catherine Figueroa MRN: [id number] Date of Birth: July 12, 1990  Date:  07/24/2024  Clinical Social Worker Initiating Note:  Toshio Slusher Date/Time: Initiated:  07/24/24/1034     Child's Name:  Catherine Figueroa 07/23/2024   Biological Parents:  Mother, Father Catherine Figueroa 14[birth year]-1989 Catherine Figueroa 07/15/1992)   Need for Interpreter:  None   Reason for Referral:  Late or No Prenatal Care     Address:  [address] [place] [address]    Phone number:  [phone] (home)     Additional phone number:   Household Members/Support Persons (HM/SP):   Household Member/Support Person 1   HM/SP Name Relationship DOB or Age  HM/SP -1 Mtthew Figueroa spouse 07/15/1992  HM/SP -2        HM/SP -3        HM/SP -4        HM/SP -5        HM/SP -6        HM/SP -7        HM/SP -8          Natural Supports (not living in the home):      Professional Supports: None   Employment: Homemaker   Type of Work:     Education:  Research scientist (physical sciences)   Homebound arranged:    Surveyor, Quantity Resources:  Medicaid   Other Resources:  Sales Executive  , [employer]   Cultural/Religious Considerations Which May Impact Care:    Strengths:  Home prepared for child  , Ability to meet basic needs     Psychotropic Medications:         Pediatrician:       Pediatrician List:   [employer]    [place]    [place] [facility]      Pediatrician Fax Number:    Risk Factors/Current Problems:  None   Cognitive State:  Alert  , Able to Concentrate     Mood/Affect:  Calm     CSW Assessment: CSW received a consult for lack of prenatal care. CSW met with MOB to complete assessment and offer support. CSW contacted MOB using room phone and confirmed pt using 2 pt identifiers. CSW introduced self, CPS rol and reason for assessment. MOB was agreeable. CSW inquired about how MOB was  feeling, MOB reported good although everything happened fast. CSW confirmed MOB address and phone number, MOB verified the information on file was correct. CSW inquired about MOB MH hx, MOB reported a hx of Anxiety and she is currently prescribed Zoloft. MOB reported the medication has been beneficial for her MH. CSW assessed for safety, MOB denied any SI or HI.CSW provided education regarding the baby blues period vs. perinatal mood disorders, discussed treatment and Figueroa resources for mental health follow up if concerns arise.  CSW recommends self-evaluation during the postpartum time period using the New Mom Checklist from Postpartum Progress and encouraged MOB to contact a medical professional if symptoms are noted at any time.  MOB identified FOB and her parents as her primary supports.  CSW inquired about MOB lack of prenatal care, MOB reported she was unaware she was pregnant. CSW explained the hospital drug screen policy due to lack of PNC. MOB verbalized understanding. CSW informed MOB infants UDS and CDS were pending, MOB verbalized understand. MOB denied substance use and denied CPS involvement. CSW provided  review of Sudden Infant Death Syndrome (SIDS) precautions.  MOB reported she has all essential items or the infant including a bassinet, crib and car seat. CSW identifies no further need for intervention and no barriers to discharge at this time.  CSW Plan/Description:  No Further Intervention Required/No Barriers to Discharge, Sudden Infant Death Syndrome (SIDS) Education, Perinatal Mood and Anxiety Disorder (PMADs) Education, CSW Will Continue to Monitor Umbilical Cord Tissue Drug Screen Results and Make Report if [facility] Drug Screen Policy Information    Catherine Figueroa, [place] 07/24/2024, 10:45 AM  "

## 2024-07-24 NOTE — Progress Notes (Signed)
 Post Partum Day 1 Subjective:  Catherine Figueroa is a 34 y.o. H5E7795 [redacted]w[redacted]d s/p SVD.  No acute events overnight.  Pt denies problems with ambulating, voiding or po intake.  She denies nausea or vomiting.  Pain is well controlled.  She has had flatus.  Lochia Small.  Plan for birth control is bilateral tubal ligation- considering interval with outpatient OB [name]) Method of Feeding: breast and formula.  Objective: Blood pressure 113/60, pulse 62, temperature 97.8 F (36.6 C), temperature source Oral, resp. rate 18, last menstrual period 05/28/2024, SpO2 98%, unknown if currently breastfeeding.  Physical Exam:  General: alert, cooperative and no distress Lochia:normal flow Chest: normal WOB Heart: Regular rate Abdomen: +BS, soft, mild TTP (appropriate) Uterine Fundus: firm, 2 below umbilicus DVT Evaluation: No evidence of DVT seen on physical exam. Extremities: minimal edema  Recent Labs    07/22/24 2010 07/24/24 0018  HGB 12.0 9.6*  HCT 35.2* 27.6*    Assessment/Plan:  ASSESSMENT: Catherine Figueroa is a 34 y.o. H5E7795 [redacted]w[redacted]d s/p SVD  Plan for discharge tomorrow Continue routine PP care Breastfeeding support PRN  #Acute blood loss anemia, clinically significant: current asymptomatic. Will start iron given drop and short interval pregnancy. Start Ferrous sulfate  every other day.    LOS: 2 days   Catherine Figueroa 07/24/2024, 12:45 PM

## 2024-07-24 NOTE — Lactation Note (Signed)
 This note was copied from a baby's chart. Lactation Consultation Note  Patient Name: Catherine Figueroa Date: 07/24/2024 Age:36 hours  Reason for consult: Follow-up assessment;Late-preterm 34-36.6wks  P4, [redacted]w[redacted]d, 3.44% weight loss  Follow up LC visit. Mother reports baby is latching well. She reports she feels tugs and her latch is good. She reports baby feeds for about 10 min each breast and then she supplements her with formula. Mother states baby is been seen today by SLP to observe her bottle feed.   Mother reports pumping 1-2 times and did not collect colostrum. She can express some colostrum when baby feeds. Mother reports that she only attempted to breast feed her last child and he was not very interested. She fed for 2 weeks, thinks her milk came in but did not experience breast engorgement at any time.   Discussed due to infant's gestational age and her history of low milk supply, that pumping every 3 hrs would be greatly beneficial for stimulating her milk milk supply.   Discussed Early term/ late preterm infant feeding behaviors and feeding plan for infant's calorie intake and stimulating her milk production:  breastfeed with feeding cues and when baby is actively sucking/ feeding at breast. Stop when baby shows fatigue. Ask for [facility] or nurse to observe baby latch /breast feed at least once a shift to assess for swallows or assist as needed.   Supplement with mother's expressed milk and/or formula. Feed baby after breastfeeding. Pump for 15 min every 3 hours in initiation phase to stimulate milk production.    Feeding Mother's Current Feeding Choice: Breast Milk and Formula Nipple Type: Extra Slow Flow  LATCH Score  Baby recently fed, skin to skin while waiting for SLP visit   Interventions Interventions: Breast feeding basics reviewed;Education  Discharge Discharge Education: Engorgement and breast care;Warning signs for feeding baby Pump:  (still waiting to hear  from [employer] regarding requested sork pump- call placed)  Consult Status Consult Status: Follow-up Date: 07/25/24 Follow-up type: In-patient    Joshua Line M 07/24/2024, 12:48 PM

## 2024-07-25 ENCOUNTER — Other Ambulatory Visit (HOSPITAL_COMMUNITY): Payer: Self-pay

## 2024-07-25 LAB — MISC LABCORP TEST (SEND OUT): Labcorp test code: 6510

## 2024-07-25 LAB — RUBELLA SCREEN: Rubella: 1.42 {index}

## 2024-07-25 MED ORDER — FERROUS SULFATE 325 (65 FE) MG PO TABS
325.0000 mg | ORAL_TABLET | ORAL | 0 refills | Status: AC
  Filled 2024-07-25: qty 15, 30d supply, fill #0

## 2024-07-25 MED ORDER — ACETAMINOPHEN 325 MG PO TABS: 650.0000 mg | ORAL_TABLET | ORAL | 0 refills | Status: DC | PRN

## 2024-07-25 MED ORDER — FERROUS SULFATE 325 (65 FE) MG PO TABS: 325.0000 mg | ORAL_TABLET | ORAL | 0 refills | Status: DC

## 2024-07-25 MED ORDER — ACETAMINOPHEN 325 MG PO TABS
650.0000 mg | ORAL_TABLET | ORAL | 0 refills | Status: AC | PRN
  Filled 2024-07-25: qty 30, 3d supply, fill #0

## 2024-07-25 MED ORDER — IBUPROFEN 600 MG PO TABS
600.0000 mg | ORAL_TABLET | Freq: Four times a day (QID) | ORAL | 0 refills | Status: AC | PRN
  Filled 2024-07-25: qty 30, 8d supply, fill #0

## 2024-07-25 MED ORDER — IBUPROFEN 600 MG PO TABS: 600.0000 mg | ORAL_TABLET | Freq: Four times a day (QID) | ORAL | 0 refills | Status: DC | PRN

## 2024-07-25 NOTE — Patient Instructions (Signed)

## 2024-07-25 NOTE — Lactation Note (Signed)
 This note was copied from a baby's chart. Lactation Consultation Note  Patient Name: Catherine Figueroa Date: 07/25/2024 Age:36 hours Reason for consult: Follow-up assessment   Maternal Data Has patient been taught Hand Expression?:  (experienced) Does the patient have breastfeeding experience prior to this delivery?: Yes  Feeding Mother's Current Feeding Choice: Breast Milk and Formula  LATCH Score                    Lactation Tools Discussed/Used Tools: Pump Breast pump type: Double-Electric Breast Pump Pump Education: Milk Storage;Setup, frequency, and cleaning Reason for Pumping: due to LPT  Interventions Interventions: Breast feeding basics reviewed;Hand pump;DEBP;Coconut oil;Education;LC Services brochure;CDC milk storage guidelines;CDC Guidelines for Breast Pump Cleaning  Discharge Discharge Education: Engorgement and breast care;Warning signs for feeding baby Pump: Received Stork Pump  Consult Status Consult Status: Complete Date: 07/25/24 Follow-up type: In-patient    Catherine Figueroa 07/25/2024, 11:07 AM

## 2024-07-25 NOTE — Progress Notes (Signed)
 Discharge medications delivered to patient's room

## 2024-07-25 NOTE — Lactation Note (Signed)
 This note was copied from a baby's chart. Lactation Consultation Note  Patient Name: Catherine Figueroa Date: 07/25/2024 Age:34 years Reason for consult: Follow-up assessment;Infant weight loss;Late-preterm 34-36.6wks;Nipple pain/trauma (6 % weight loss,) Per mom  the baby has breast fed recently and supplemented.  LC reviewed with mom the doc flow sheets and updated the supplementation for the night shift.  LC reviewed the [facility] Plan for a LPT baby,  Feed with cues and by 3 hours, STS.  1st feed at the breast and then supplement with EBM or formula working up 30 ml . After the baby is settled post pump 15 mins and save the milk for the next feeding. Next feeding switch and feed on the other breast and do the same.  MOM aware of the storage of breast milk and has the [facility] resource sheet.   Maternal Data Has patient been taught Hand Expression?:  (experienced) Does the patient have breastfeeding experience prior to this delivery?: Yes  Feeding Mother's Current Feeding Choice: Breast Milk and Formula  LATCH Score - 8     Lactation Tools Discussed/Used Tools: Pump Breast pump type: Double-Electric Breast Pump Pump Education: Milk Storage;Setup, frequency, and cleaning Reason for Pumping: due to LPT  Interventions Interventions: Breast feeding basics reviewed;Hand pump;DEBP;Coconut oil;Education;LC Services brochure;CDC milk storage guidelines;CDC Guidelines for Breast Pump Cleaning  Discharge Discharge Education: Engorgement and breast care;Warning signs for feeding baby Pump: Referral sent for [facility] Pump [facility] sent the referral for the 2nd time.)  Consult Status Consult Status: Complete Date: 07/25/24 Follow-up type: In-patient    Catherine Figueroa 07/25/2024, 9:12 AM

## 2024-07-26 LAB — MISC LABCORP TEST (SEND OUT): Labcorp test code: 12005

## 2024-08-01 ENCOUNTER — Telehealth (HOSPITAL_COMMUNITY): Payer: Self-pay | Admitting: *Deleted

## 2024-08-01 NOTE — Telephone Encounter (Signed)
 08/01/2024  Name: Raymona Boss MRN: [id number] DOB: 1991/04/11  Reason for Call:  Transition of Care Hospital Discharge Call  Contact Status: Patient Contact Status: Complete  Language assistant needed: Interpreter Mode: Interpreter Not Needed        Follow-Up Questions: Do You Have Any Concerns About Your Health As You Heal From Delivery?: No Do You Have Any Concerns About Your Infants Health?: No  Edinburgh Postnatal Depression Scale:  In the Past 7 Days: I have been able to laugh and see the funny side of things.: As much as I always could I have looked forward with enjoyment to things.: As much as I ever did I have blamed myself unnecessarily when things went wrong.: Yes, some of the time I have been anxious or worried for no good reason.: Yes, sometimes I have felt scared or panicky for no good reason.: No, not much Things have been getting on top of me.: No, I have been coping as well as ever I have been so unhappy that I have had difficulty sleeping.: Not at all I have felt sad or miserable.: No, not at all I have been so unhappy that I have been crying.: No, never The thought of harming myself has occurred to me.: Never Edinburgh Postnatal Depression Scale Total: 5  PHQ2-9 Depression Scale:     Discharge Follow-up: Edinburgh score requires follow up?: No Patient was advised of the following resources:: Support Group, Breastfeeding Support Group  Post-discharge interventions: Reviewed Newborn Safe Sleep Practices  Mliss Sieve, RN 08/01/2024 13:58

## 2024-09-04 ENCOUNTER — Ambulatory Visit: Payer: Self-pay | Admitting: Obstetrics and Gynecology
